# Patient Record
Sex: Female | Born: 1970 | Race: Black or African American | Marital: Single | Smoking: Never smoker
Health system: Southern US, Community
[De-identification: ages and names within clinical notes are randomized; demographics above are authoritative.]

## PROBLEM LIST (undated history)

## (undated) DIAGNOSIS — I471 Supraventricular tachycardia, unspecified: Secondary | ICD-10-CM

## (undated) DIAGNOSIS — K469 Unspecified abdominal hernia without obstruction or gangrene: Secondary | ICD-10-CM

## (undated) DIAGNOSIS — I82409 Acute embolism and thrombosis of unspecified deep veins of unspecified lower extremity: Secondary | ICD-10-CM

## (undated) DIAGNOSIS — K760 Fatty (change of) liver, not elsewhere classified: Secondary | ICD-10-CM

## (undated) DIAGNOSIS — D649 Anemia, unspecified: Secondary | ICD-10-CM

## (undated) DIAGNOSIS — M549 Dorsalgia, unspecified: Secondary | ICD-10-CM

## (undated) DIAGNOSIS — R002 Palpitations: Secondary | ICD-10-CM

## (undated) DIAGNOSIS — E7800 Pure hypercholesterolemia, unspecified: Secondary | ICD-10-CM

## (undated) DIAGNOSIS — D3500 Benign neoplasm of unspecified adrenal gland: Secondary | ICD-10-CM

## (undated) DIAGNOSIS — I10 Essential (primary) hypertension: Secondary | ICD-10-CM

## (undated) DIAGNOSIS — M62838 Other muscle spasm: Secondary | ICD-10-CM

## (undated) DIAGNOSIS — E876 Hypokalemia: Secondary | ICD-10-CM

## (undated) DIAGNOSIS — N926 Irregular menstruation, unspecified: Secondary | ICD-10-CM

## (undated) DIAGNOSIS — D219 Benign neoplasm of connective and other soft tissue, unspecified: Secondary | ICD-10-CM

## (undated) DIAGNOSIS — N83209 Unspecified ovarian cyst, unspecified side: Secondary | ICD-10-CM

## (undated) DIAGNOSIS — E278 Other specified disorders of adrenal gland: Secondary | ICD-10-CM

## (undated) DIAGNOSIS — E119 Type 2 diabetes mellitus without complications: Secondary | ICD-10-CM

## (undated) DIAGNOSIS — G4730 Sleep apnea, unspecified: Secondary | ICD-10-CM

## (undated) DIAGNOSIS — M542 Cervicalgia: Secondary | ICD-10-CM

## (undated) DIAGNOSIS — M4800 Spinal stenosis, site unspecified: Secondary | ICD-10-CM

## (undated) HISTORY — DX: Dorsalgia, unspecified: M54.9

## (undated) HISTORY — DX: Unspecified abdominal hernia without obstruction or gangrene: K46.9

## (undated) HISTORY — PX: NO PAST SURGERIES: SHX2092

## (undated) HISTORY — DX: Irregular menstruation, unspecified: N92.6

## (undated) HISTORY — DX: Anemia, unspecified: D64.9

## (undated) HISTORY — DX: Unspecified ovarian cyst, unspecified side: N83.209

## (undated) HISTORY — DX: Palpitations: R00.2

## (undated) HISTORY — DX: Sleep apnea, unspecified: G47.30

## (undated) HISTORY — DX: Other muscle spasm: M62.838

## (undated) HISTORY — DX: Cervicalgia: M54.2

## (undated) HISTORY — DX: Hypokalemia: E87.6

---

## 2009-04-11 ENCOUNTER — Emergency Department: Payer: Self-pay

## 2009-04-26 ENCOUNTER — Emergency Department: Payer: Self-pay | Admitting: Emergency Medicine

## 2010-06-12 ENCOUNTER — Inpatient Hospital Stay: Payer: Self-pay | Admitting: Internal Medicine

## 2010-06-15 ENCOUNTER — Ambulatory Visit: Payer: Self-pay | Admitting: Internal Medicine

## 2010-08-29 ENCOUNTER — Ambulatory Visit: Payer: Self-pay

## 2010-10-13 ENCOUNTER — Emergency Department: Payer: Self-pay | Admitting: Emergency Medicine

## 2010-12-04 ENCOUNTER — Other Ambulatory Visit: Payer: Self-pay | Admitting: Orthopedic Surgery

## 2010-12-04 DIAGNOSIS — M25561 Pain in right knee: Secondary | ICD-10-CM

## 2010-12-09 ENCOUNTER — Ambulatory Visit
Admission: RE | Admit: 2010-12-09 | Discharge: 2010-12-09 | Disposition: A | Discharge status: Home / Self Care | Payer: BC Managed Care – PPO | Source: Ambulatory Visit | Attending: Orthopedic Surgery | Admitting: Orthopedic Surgery

## 2010-12-09 DIAGNOSIS — M25561 Pain in right knee: Secondary | ICD-10-CM

## 2011-02-19 ENCOUNTER — Ambulatory Visit: Payer: Self-pay

## 2011-04-19 ENCOUNTER — Ambulatory Visit: Payer: Self-pay

## 2011-11-14 ENCOUNTER — Encounter (HOSPITAL_COMMUNITY): Payer: Self-pay | Admitting: Emergency Medicine

## 2011-11-14 ENCOUNTER — Emergency Department (HOSPITAL_COMMUNITY): Payer: No Typology Code available for payment source

## 2011-11-14 ENCOUNTER — Emergency Department (HOSPITAL_COMMUNITY)
Admission: EM | Admit: 2011-11-14 | Discharge: 2011-11-14 | Disposition: A | Discharge status: Home / Self Care | Payer: No Typology Code available for payment source | Attending: Emergency Medicine | Admitting: Emergency Medicine

## 2011-11-14 DIAGNOSIS — Z79899 Other long term (current) drug therapy: Secondary | ICD-10-CM | POA: Insufficient documentation

## 2011-11-14 DIAGNOSIS — M25469 Effusion, unspecified knee: Secondary | ICD-10-CM

## 2011-11-14 DIAGNOSIS — Z86718 Personal history of other venous thrombosis and embolism: Secondary | ICD-10-CM | POA: Insufficient documentation

## 2011-11-14 DIAGNOSIS — M79609 Pain in unspecified limb: Secondary | ICD-10-CM | POA: Insufficient documentation

## 2011-11-14 DIAGNOSIS — E119 Type 2 diabetes mellitus without complications: Secondary | ICD-10-CM | POA: Insufficient documentation

## 2011-11-14 DIAGNOSIS — Y92410 Unspecified street and highway as the place of occurrence of the external cause: Secondary | ICD-10-CM | POA: Insufficient documentation

## 2011-11-14 DIAGNOSIS — I10 Essential (primary) hypertension: Secondary | ICD-10-CM | POA: Insufficient documentation

## 2011-11-14 DIAGNOSIS — M549 Dorsalgia, unspecified: Secondary | ICD-10-CM | POA: Insufficient documentation

## 2011-11-14 DIAGNOSIS — M542 Cervicalgia: Secondary | ICD-10-CM | POA: Insufficient documentation

## 2011-11-14 HISTORY — DX: Supraventricular tachycardia: I47.1

## 2011-11-14 HISTORY — DX: Essential (primary) hypertension: I10

## 2011-11-14 HISTORY — DX: Acute embolism and thrombosis of unspecified deep veins of unspecified lower extremity: I82.409

## 2011-11-14 HISTORY — DX: Supraventricular tachycardia, unspecified: I47.10

## 2011-11-14 HISTORY — DX: Other specified disorders of adrenal gland: E27.8

## 2011-11-14 HISTORY — DX: Benign neoplasm of connective and other soft tissue, unspecified: D21.9

## 2011-11-14 MED ORDER — CYCLOBENZAPRINE HCL 5 MG PO TABS: 5.0000 mg | ORAL_TABLET | Freq: Three times a day (TID) | ORAL | Status: AC | PRN

## 2011-11-14 MED ORDER — HYDROCODONE-ACETAMINOPHEN 5-325 MG PO TABS: 1.0000 | ORAL_TABLET | ORAL | Status: AC | PRN
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
## 2011-11-14 NOTE — Discharge Instructions (Signed)
Knee Effusion The medical term for having fluid in your knee is effusion. This is often due to an internal derangement of the knee. This means something is wrong inside the knee. Some of the causes of fluid in the knee may be torn cartilage, a torn ligament, or bleeding into the joint from an injury. Your knee is likely more difficult to bend and move. This is often because there is increased pain and pressure in the joint. The time it takes for recovery from a knee effusion depends on different factors, including:   Type of injury.   Your age.   Physical and medical conditions.   Rehabilitation Strategies.  How long you will be away from your normal activities will depend on what kind of knee problem you have and how much damage is present. Your knee has two types of cartilage. Articular cartilage covers the bone ends and lets your knee bend and move smoothly. Two menisci, thick pads of cartilage that form a rim inside the joint, help absorb shock and stabilize your knee. Ligaments bind the bones together and support your knee joint. Muscles move the joint, help support your knee, and take stress off the joint itself. CAUSES  Often an effusion in the knee is caused by an injury to one of the menisci. This is often a tear in the cartilage. Recovery after a meniscus injury depends on how much meniscus is damaged and whether you have damaged other knee tissue. Small tears may heal on their own with conservative treatment. Conservative means rest, limited weight bearing activity and muscle strengthening exercises. Your recovery may take up to 6 weeks.  TREATMENT  Larger tears may require surgery. Meniscus injuries may be treated during arthroscopy. Arthroscopy is a procedure in which your surgeon uses a small telescope like instrument to look in your knee. Your caregiver can make a more accurate diagnosis (learning what is wrong) by performing an arthroscopic procedure. If your injury is on the inner  margin of the meniscus, your surgeon may trim the meniscus back to a smooth rim. In other cases your surgeon will try to repair a damaged meniscus with stitches (sutures). This may make rehabilitation take longer, but may provide better long term result by helping your knee keep its shock absorption capabilities. Ligaments which are completely torn usually require surgery for repair. HOME CARE INSTRUCTIONS  Use crutches as instructed.   If a brace is applied, use as directed.   Once you are home, an ice pack applied to your swollen knee may help with discomfort and help decrease swelling.   Keep your knee raised (elevated) when you are not up and around or on crutches.   Only take over-the-counter or prescription medicines for pain, discomfort, or fever as directed by your caregiver.   Your caregivers will help with instructions for rehabilitation of your knee. This often includes strengthening exercises.   You may resume a normal diet and activities as directed.  SEEK MEDICAL CARE IF:   There is increased swelling in your knee.   You notice redness, swelling, or increasing pain in your knee.   An unexplained oral temperature above 102 F (38.9 C) develops.  SEEK IMMEDIATE MEDICAL CARE IF:   You develop a rash.   You have difficulty breathing.   You have any allergic reactions from medications you may have been given.   There is severe pain with any motion of the knee.  MAKE SURE YOU:   Understand these instructions.  Will watch your condition.   Will get help right away if you are not doing well or get worse.  Document Released: 12/07/2003 Document Revised: 05/29/2011 Document Reviewed: 02/10/2008 [facility] Patient Information 2012 [place], [place].Motor Vehicle Collision  It is common to have multiple bruises and sore muscles after a motor vehicle collision (MVC). These tend to feel worse for the first 24 hours. You may have the most stiffness and soreness over the first  several hours. You may also feel worse when you wake up the first morning after your collision. After this point, you will usually begin to improve with each day. The speed of improvement often depends on the severity of the collision, the number of injuries, and the location and nature of these injuries. HOME CARE INSTRUCTIONS   Put ice on the injured area.   Put ice in a plastic bag.   Place a towel between your skin and the bag.   Leave the ice on for 15 to 20 minutes, 3 to 4 times a day.   Drink enough fluids to keep your urine clear or pale yellow. Do not drink alcohol.   Take a warm shower or bath once or twice a day. This will increase blood flow to sore muscles.   You may return to activities as directed by your caregiver. Be careful when lifting, as this may aggravate neck or back pain.   Only take over-the-counter or prescription medicines for pain, discomfort, or fever as directed by your caregiver. Do not use aspirin. This may increase bruising and bleeding.  SEEK IMMEDIATE MEDICAL CARE IF:  You have numbness, tingling, or weakness in the arms or legs.   You develop severe headaches not relieved with medicine.   You have severe neck pain, especially tenderness in the middle of the back of your neck.   You have changes in bowel or bladder control.   There is increasing pain in any area of the body.   You have shortness of breath, lightheadedness, dizziness, or fainting.   You have chest pain.   You feel sick to your stomach (nauseous), throw up (vomit), or sweat.   You have increasing abdominal discomfort.   There is blood in your urine, stool, or vomit.   You have pain in your shoulder (shoulder strap areas).   You feel your symptoms are getting worse.  MAKE SURE YOU:   Understand these instructions.   Will watch your condition.   Will get help right away if you are not doing well or get worse.  Document Released: 09/16/2005 Document Revised: 05/29/2011  Document Reviewed: 02/13/2011 [facility] Patient Information 2012 [place], [place].Motor Vehicle Collision  It is common to have multiple bruises and sore muscles after a motor vehicle collision (MVC). These tend to feel worse for the first 24 hours. You may have the most stiffness and soreness over the first several hours. You may also feel worse when you wake up the first morning after your collision. After this point, you will usually begin to improve with each day. The speed of improvement often depends on the severity of the collision, the number of injuries, and the location and nature of these injuries. HOME CARE INSTRUCTIONS   Put ice on the injured area.   Put ice in a plastic bag.   Place a towel between your skin and the bag.   Leave the ice on for 15 to 20 minutes, 3 to 4 times a day.   Drink enough fluids to keep your  urine clear or pale yellow. Do not drink alcohol.   Take a warm shower or bath once or twice a day. This will increase blood flow to sore muscles.   You may return to activities as directed by your caregiver. Be careful when lifting, as this may aggravate neck or back pain.   Only take over-the-counter or prescription medicines for pain, discomfort, or fever as directed by your caregiver. Do not use aspirin. This may increase bruising and bleeding.  SEEK IMMEDIATE MEDICAL CARE IF:  You have numbness, tingling, or weakness in the arms or legs.   You develop severe headaches not relieved with medicine.   You have severe neck pain, especially tenderness in the middle of the back of your neck.   You have changes in bowel or bladder control.   There is increasing pain in any area of the body.   You have shortness of breath, lightheadedness, dizziness, or fainting.   You have chest pain.   You feel sick to your stomach (nauseous), throw up (vomit), or sweat.   You have increasing abdominal discomfort.   There is blood in your urine, stool, or vomit.   You  have pain in your shoulder (shoulder strap areas).   You feel your symptoms are getting worse.  MAKE SURE YOU:  Understand these instructions. Motor Vehicle Collision  It is common to have multiple bruises and sore muscles after a motor vehicle collision (MVC). These tend to feel worse for the first 24 hours. You may have the most stiffness and soreness over the first several hours. You may also feel worse when you wake up the first morning after your collision. After this point, you will usually begin to improve with each day. The speed of improvement often depends on the severity of the collision, the number of injuries, and the location and nature of these injuries. HOME CARE INSTRUCTIONS  Put ice on the injured area.  Put ice in a plastic bag.  Place a towel between your skin and the bag.  Leave the ice on for 15 to 20 minutes, 3 to 4 times a day.  Drink enough fluids to keep your urine clear or pale yellow. Do not drink alcohol.  Take a warm shower or bath once or twice a day. This will increase blood flow to sore muscles.  You may return to activities as directed by your caregiver. Be careful when lifting, as this may aggravate neck or back pain.  Only take over-the-counter or prescription medicines for pain, discomfort, or fever as directed by your caregiver. Do not use aspirin. This may increase bruising and bleeding.  SEEK IMMEDIATE MEDICAL CARE IF: You have numbness, tingling, or weakness in the arms or legs.  You develop severe headaches not relieved with medicine.  You have severe neck pain, especially tenderness in the middle of the back of your neck.  You have changes in bowel or bladder control.  There is increasing pain in any area of the body.  You have shortness of breath, lightheadedness, dizziness, or fainting.  You have chest pain.  You feel sick to your stomach (nauseous), throw up (vomit), or sweat.  You have increasing abdominal discomfort.  There is blood in your  urine, stool, or vomit.  You have pain in your shoulder (shoulder strap areas).  You feel your symptoms are getting worse.  MAKE SURE YOU:  Understand these instructions.  Will watch your condition.  Will get help right away if you are not  doing well or get worse.  Document Released: 09/16/2005 Document Revised: 05/29/2011 Document Reviewed: 02/13/2011  [facility] Patient Information 2012 [place], [place].  Will watch your condition.   Will get help right away if you are not doing well or get worse.  Document Released: 09/16/2005 Document Revised: 05/29/2011 Document Reviewed: 02/13/2011 [facility] Patient Information 2012 [place], [place].

## 2011-11-18 NOTE — ED Provider Notes (Signed)
History/physical exam/procedure(s) were performed by non-physician practitioner and as supervising physician I was immediately available for consultation/collaboration. I have reviewed all notes and am in agreement with care and plan.   Hilario Quarry, MD 11/18/11 [phone]

## 2012-06-04 ENCOUNTER — Ambulatory Visit: Payer: Self-pay | Admitting: Internal Medicine

## 2012-07-21 ENCOUNTER — Other Ambulatory Visit: Payer: Self-pay | Admitting: Internal Medicine

## 2012-09-09 ENCOUNTER — Ambulatory Visit
Admission: RE | Admit: 2012-09-09 | Discharge: 2012-09-09 | Disposition: A | Discharge status: Home / Self Care | Payer: BC Managed Care – PPO | Source: Ambulatory Visit | Attending: Internal Medicine | Admitting: Internal Medicine

## 2012-12-18 ENCOUNTER — Ambulatory Visit
Admission: RE | Admit: 2012-12-18 | Discharge: 2012-12-18 | Disposition: A | Discharge status: Home / Self Care | Payer: BC Managed Care – PPO | Source: Ambulatory Visit | Attending: Internal Medicine | Admitting: Internal Medicine

## 2012-12-18 ENCOUNTER — Emergency Department (HOSPITAL_COMMUNITY)
Admission: EM | Admit: 2012-12-18 | Discharge: 2012-12-18 | Disposition: A | Discharge status: Home / Self Care | Payer: BC Managed Care – PPO | Attending: Emergency Medicine | Admitting: Emergency Medicine

## 2012-12-18 ENCOUNTER — Emergency Department (HOSPITAL_COMMUNITY): Payer: BC Managed Care – PPO

## 2012-12-18 ENCOUNTER — Encounter (HOSPITAL_COMMUNITY): Payer: Self-pay

## 2012-12-18 ENCOUNTER — Other Ambulatory Visit: Payer: Self-pay | Admitting: Family

## 2012-12-18 DIAGNOSIS — I4891 Unspecified atrial fibrillation: Secondary | ICD-10-CM

## 2012-12-18 DIAGNOSIS — Z8719 Personal history of other diseases of the digestive system: Secondary | ICD-10-CM | POA: Insufficient documentation

## 2012-12-18 DIAGNOSIS — Z862 Personal history of diseases of the blood and blood-forming organs and certain disorders involving the immune mechanism: Secondary | ICD-10-CM | POA: Insufficient documentation

## 2012-12-18 DIAGNOSIS — R350 Frequency of micturition: Secondary | ICD-10-CM | POA: Insufficient documentation

## 2012-12-18 DIAGNOSIS — E119 Type 2 diabetes mellitus without complications: Secondary | ICD-10-CM | POA: Insufficient documentation

## 2012-12-18 DIAGNOSIS — R079 Chest pain, unspecified: Secondary | ICD-10-CM | POA: Insufficient documentation

## 2012-12-18 DIAGNOSIS — R002 Palpitations: Secondary | ICD-10-CM | POA: Insufficient documentation

## 2012-12-18 DIAGNOSIS — R000 Tachycardia, unspecified: Secondary | ICD-10-CM

## 2012-12-18 DIAGNOSIS — Z8639 Personal history of other endocrine, nutritional and metabolic disease: Secondary | ICD-10-CM | POA: Insufficient documentation

## 2012-12-18 DIAGNOSIS — Z8739 Personal history of other diseases of the musculoskeletal system and connective tissue: Secondary | ICD-10-CM | POA: Insufficient documentation

## 2012-12-18 DIAGNOSIS — I10 Essential (primary) hypertension: Secondary | ICD-10-CM | POA: Insufficient documentation

## 2012-12-18 HISTORY — DX: Benign neoplasm of unspecified adrenal gland: D35.00

## 2012-12-18 HISTORY — DX: Pure hypercholesterolemia, unspecified: E78.00

## 2012-12-18 HISTORY — DX: Fatty (change of) liver, not elsewhere classified: K76.0

## 2012-12-18 HISTORY — DX: Spinal stenosis, site unspecified: M48.00

## 2012-12-18 HISTORY — DX: Type 2 diabetes mellitus without complications: E11.9

## 2012-12-18 LAB — COMPREHENSIVE METABOLIC PANEL
Albumin: 3.9 g/dL (ref 3.5–5.2)
Alkaline Phosphatase: 81 U/L (ref 39–117)
BUN: 12 mg/dL (ref 6–23)
Chloride: 98 mEq/L (ref 96–112)
Creatinine, Ser: 0.85 mg/dL (ref 0.50–1.10)
GFR calc Af Amer: >90 mL/min (ref >90)
Glucose, Bld: 106 mg/dL — ABNORMAL HIGH (ref 70–99)
Potassium: 3.5 mEq/L (ref 3.5–5.1)
Total Bilirubin: 0.4 mg/dL (ref 0.3–1.2)

## 2012-12-18 LAB — CBC WITH DIFFERENTIAL/PLATELET
Basophils Absolute: 0 10*3/uL (ref 0.0–0.1)
Basophils Relative: 0 % (ref 0–1)
Eosinophils Absolute: 0.1 10*3/uL (ref 0.0–0.7)
HCT: 39.5 % (ref 36.0–46.0)
Hemoglobin: 13 g/dL (ref 12.0–15.0)
Lymphs Abs: 2.6 10*3/uL (ref 0.7–4.0)
MCHC: 32.9 g/dL (ref 30.0–36.0)
MCV: 70.8 fL — ABNORMAL LOW (ref 78.0–100.0)
Monocytes Relative: 7 % (ref 3–12)
Neutro Abs: 4.6 10*3/uL (ref 1.7–7.7)
RDW: 14.9 % (ref 11.5–15.5)

## 2012-12-18 LAB — PROTIME-INR
INR: 0.96 (ref 0.00–1.49)
Prothrombin Time: 12.7 seconds (ref 11.6–15.2)

## 2012-12-18 MED ORDER — SODIUM CHLORIDE 0.9 % IV BOLUS (SEPSIS)
1000.0000 mL | Freq: Once | INTRAVENOUS | Status: AC
  Administered 2012-12-18: 1000 mL via INTRAVENOUS

## 2012-12-18 MED ORDER — IOHEXOL 350 MG/ML SOLN
60.0000 mL | Freq: Once | INTRAVENOUS | Status: AC | PRN
  Administered 2012-12-18: 60 mL via INTRAVENOUS

## 2012-12-18 NOTE — ED Provider Notes (Signed)
I have supervised the resident on the management of this patient and agree with the note above. I personally interviewed and examined the patient and my addendum is below.   Diana Patton is a 42 y.o. female hx of R leg DVT off coumadin here with SOB. SOB at baseline, worse over last few days. Outpatient xray showed pneumothorax. Patient tachycardic 130s on the monitor, with nonspecific changes that is similar to prior. Good breath sounds bilaterally, not hypoxic. Will repeat CXR. Concerned for possible PE so if CXR showed insignificant pneumothorax, may need CT angio chest.   7:44 PM CXR showed no pneumothorax. CT angio chest showed no PE. Tachycardia resolved and back to sinus rhythm in 70s. Stable for d/c.   Richardean Canal, MD 12/18/12 [phone]

## 2012-12-18 NOTE — ED Notes (Signed)
Breath sounds auscultated by this RN in all fields. No labored breathing noted. Pt tearful, states she is scared. Pt reassured by this RN. Pt states she feels sob "but I'm overweight so it might be from that."

## 2012-12-18 NOTE — ED Notes (Signed)
Received bedside report from [employer], Charity fundraiser.  Patient currently sitting up in bed; no respiratory or acute distress noted.  Patient given happy meal ([place] sandwich) by nurse tech; Gildardo Cranker by EDP.  Repeat ED EKG done and given to EDP.  Patient denies any needs at this time; will continue to monitor.

## 2012-12-18 NOTE — ED Notes (Signed)
Patient given discharge instructions for tachycardia. No rx was provided. Advised to follow up with primary care or to return to this department if condition worsens. Patient voiced understanding of all instructions and had no further questions. Patient ambulated to front lobby without difficulty

## 2012-12-18 NOTE — ED Notes (Signed)
Patient report obtained from Crystal, care taken over at this time.

## 2012-12-18 NOTE — ED Provider Notes (Signed)
History     CSN: [id number]  Arrival date & time 12/18/12  1433   First MD Initiated Contact with Patient 12/18/12 1500      No chief complaint on file.   (Consider location/radiation/quality/duration/timing/severity/associated sxs/prior treatment) Patient is a 42 y.o. female presenting with palpitations.  Palpitations  This is a new problem. The current episode started 6 to 12 hours ago. The problem occurs constantly. The problem has not changed since onset.The problem is associated with an unknown factor. Associated symptoms include chest pain ("burning") and shortness of breath. Pertinent negatives include no fever, no abdominal pain, no nausea, no vomiting and no cough. She has tried nothing for the symptoms.    Past Medical History  Diagnosis Date  . Hypertension   . Diabetes mellitus without complication   . Benign tumor of adrenal gland   . Spinal stenosis   . Fatty liver   . Hypercholesterolemia     History reviewed. No pertinent past surgical history.  History reviewed. No pertinent family history.  History  Substance Use Topics  . Smoking status: Never Smoker   . Smokeless tobacco: Not on file  . Alcohol Use: No    OB History   Grav Para Term Preterm Abortions TAB SAB Ect Mult Living                  Review of Systems  Constitutional: Negative for fever.  HENT: Negative for congestion.   Respiratory: Positive for shortness of breath. Negative for cough.   Cardiovascular: Positive for chest pain ("burning") and palpitations.  Gastrointestinal: Negative for nausea, vomiting, abdominal pain and diarrhea.  Genitourinary: Positive for frequency. Negative for difficulty urinating.  All other systems reviewed and are negative.    Allergies  Review of patient's allergies indicates no known allergies.  Home Medications  No current outpatient prescriptions on file.  BP 145/96  Pulse 131  Temp(Src) 98.1 F (36.7 C) (Oral)  Resp 26  SpO2 96%  LMP  11/06/2012  Physical Exam  Nursing note and vitals reviewed. Constitutional: She is oriented to person, place, and time. She appears well-developed and well-nourished. No distress.  obese  HENT:  Head: Normocephalic and atraumatic.  Mouth/Throat: Oropharynx is clear and moist.  Eyes: Conjunctivae are normal. Pupils are equal, round, and reactive to light. No scleral icterus.  Neck: Neck supple.  Cardiovascular: Normal rate, regular rhythm, normal heart sounds and intact distal pulses.   No murmur heard. Pulmonary/Chest: Effort normal and breath sounds normal. No stridor. No respiratory distress. She has no wheezes. She has no rales. She exhibits no tenderness.  Abdominal: Soft. Bowel sounds are normal. She exhibits no distension. There is no tenderness. There is no rebound and no guarding.  Musculoskeletal: Normal range of motion. She exhibits no edema.  Neurological: She is alert and oriented to person, place, and time.  Skin: Skin is warm and dry. No rash noted.  Psychiatric: She has a normal mood and affect. Her behavior is normal.    ED Course  Procedures (including critical care time)  Labs Reviewed - No data to display No results found.   Date: 12/18/2012  Rate: 132  Rhythm: indeterminate  QRS Axis: normal  Intervals: normal  ST/T Wave abnormalities: nonspecific ST/T changes  Conduction Disutrbances:none  Narrative Interpretation:   Old EKG Reviewed: none available   1. Tachycardia       MDM   3:18 PM 42 yo female presenting from her PCP secondary to a chest xray demonstrating "partial  lung collapse".  Do not have access to this film, so will repeat.  Her complaint to her PCP was palpitations.  She is tachycardic here, but in no distress.  Burning, but not pleuritic, chest pain.  Plan to obtain labs and plain film, but will keep PE in differential for now.  Hx not consistent with  ACS.    3:28 PM EKG shows an indeterminate rhythm (regular appearing rhythm with no  discernable p waves).  Rate would be slow for SVT and pt stable.  Plan to treat with IV fluids and see results of imaging/labs.    4:28 PM no Pneumothorax.  Will order CTA PE study.    7:26 PM CTA negative.  Pt in sinus rhythm with a rate in 70's on monitor.  7:38 PM EKG confirms NSR with nonspecific T wave changes consistent with prior.  She will follow up with her PCP who is in process of referring her to cardiology for her intermittent tachycardia.  Asymptomatic at moment.     Rennis Petty, MD 12/18/12 [phone]

## 2012-12-18 NOTE — ED Notes (Signed)
Pt presents with reported pneumothorax from  PCP.  Pt reports she awoke with palpitations and went to PCP, had xray that showed pneumothorax.  Pt reports shortness of breath today, denies any pain.

## 2012-12-21 ENCOUNTER — Encounter: Payer: Self-pay | Admitting: *Deleted

## 2012-12-21 ENCOUNTER — Telehealth: Payer: Self-pay

## 2012-12-21 NOTE — Telephone Encounter (Signed)
LMOM TCB x1 for [facility] have been received from [place], per cxr and CTA no pneumothorax, positive for ateclectasis in both lower lobes No openings in office today for new patient ATC pt to discuss her symptoms > line rang multiple times, then switched to busy signal. Records given to [place] for 1145 tomorrow @ 3.25.14

## 2012-12-22 ENCOUNTER — Ambulatory Visit (INDEPENDENT_AMBULATORY_CARE_PROVIDER_SITE_OTHER): Payer: BC Managed Care – PPO | Admitting: Pulmonary Disease

## 2012-12-22 ENCOUNTER — Encounter: Payer: Self-pay | Admitting: Pulmonary Disease

## 2012-12-22 ENCOUNTER — Encounter: Payer: Self-pay | Admitting: *Deleted

## 2012-12-22 VITALS — BP 130/88 | HR 66 | Temp 98.0°F | Ht 68.0 in | Wt 347.4 lb

## 2012-12-22 DIAGNOSIS — R0609 Other forms of dyspnea: Secondary | ICD-10-CM

## 2012-12-22 DIAGNOSIS — R0989 Other specified symptoms and signs involving the circulatory and respiratory systems: Secondary | ICD-10-CM

## 2012-12-22 DIAGNOSIS — R0600 Dyspnea, unspecified: Secondary | ICD-10-CM | POA: Insufficient documentation

## 2012-12-22 NOTE — Assessment & Plan Note (Signed)
The pt has significant doe that I suspect is primarily related to her morbid obesity and deconditioning.  Would also consider whether she may have an underlying cardiac issue such as diastolic dysfunction or pulmonary htn.  May need cardiac evaluation per her primary md.  I see nothing by history, exam, or xrays to indicate a pulmonary issue.  I am willing to do PFT"s since they can provide a lot of information, but suspect they will be unremarkable except for restriction from her centripetal obesity.

## 2012-12-22 NOTE — Patient Instructions (Addendum)
Will schedule for breathing studies in the next few weeks, and will call you with results. Work on weight loss and conditioning.

## 2012-12-22 NOTE — Telephone Encounter (Signed)
Will sign off message as pt has appt for today with [facility]

## 2012-12-22 NOTE — Progress Notes (Signed)
  Subjective:    Patient ID: Diana Patton, female    DOB: [birth year], 42 y.o.   MRN: [id number]  HPI The pt is a 42y/o female who I have been asked to see for doe.  She is morbidly obese, and has had a recent ct chest with no PE, and no pulmonary abnl except mild compressive atx in both bases.  She notes worsening sob above her usual baseline the past 2 weeks.  Denies sob at rest.  She describes a less than one block doe at a moderate pace on flat ground, and cannot walk up stairs without getting severely sob.  She denies any cough or mucus production, and has some intermittant LE edema.  She tells me her weight is down a few pounds from one year ago.  She has no h/o asthma, and has never had PFT's.     Review of Systems  Constitutional: Negative for fever and unexpected weight change.  HENT: Positive for rhinorrhea, sneezing, postnasal drip and sinus pressure. Negative for ear pain, nosebleeds, congestion, sore throat, trouble swallowing and dental problem.   Eyes: Negative for redness and itching.  Respiratory: Positive for cough. Negative for chest tightness, shortness of breath and wheezing.   Cardiovascular: Positive for palpitations. Negative for leg swelling.  Gastrointestinal: Negative for nausea and vomiting.  Genitourinary: Positive for frequency. Negative for dysuria.  Musculoskeletal: Negative for joint swelling.  Skin: Positive for rash ( stomach x 2 weeks--itching).  Neurological: Positive for headaches.  Hematological: Does not bruise/bleed easily.  Psychiatric/Behavioral: Positive for dysphoric mood. The patient is nervous/anxious.        Objective:   Physical Exam Constitutional:  Morbidly obese female, no acute distress  HENT:  Nares patent without discharge  Oropharynx without exudate, palate and uvula are elongated.  Eyes:  Perrla, eomi, no scleral icterus  Neck:  No JVD, +tissue redundancy that is ?TMG  Cardiovascular:  Normal rate, regular rhythm, no rubs or  gallops.  No murmurs        Intact distal pulses  Pulmonary :  Normal breath sounds, no stridor or respiratory distress   No rhonchi, or wheezing.  Minimal bibasilar crackles.   Abdominal:  Soft, nondistended, bowel sounds present.  No tenderness noted.   Musculoskeletal:  mild lower extremity edema noted.  Lymph Nodes:  No cervical lymphadenopathy noted  Skin:  No cyanosis noted  Neurologic:  Alert, appropriate, moves all 4 extremities without obvious deficit.         Assessment & Plan:

## 2012-12-23 ENCOUNTER — Ambulatory Visit (HOSPITAL_COMMUNITY)
Admission: RE | Admit: 2012-12-23 | Discharge: 2012-12-23 | Disposition: A | Discharge status: Home / Self Care | Payer: BC Managed Care – PPO | Source: Ambulatory Visit | Attending: Pulmonary Disease | Admitting: Pulmonary Disease

## 2012-12-23 DIAGNOSIS — R05 Cough: Secondary | ICD-10-CM | POA: Insufficient documentation

## 2012-12-23 DIAGNOSIS — R0600 Dyspnea, unspecified: Secondary | ICD-10-CM

## 2012-12-23 DIAGNOSIS — R0989 Other specified symptoms and signs involving the circulatory and respiratory systems: Secondary | ICD-10-CM | POA: Insufficient documentation

## 2012-12-23 DIAGNOSIS — R0609 Other forms of dyspnea: Secondary | ICD-10-CM | POA: Insufficient documentation

## 2012-12-23 DIAGNOSIS — J988 Other specified respiratory disorders: Secondary | ICD-10-CM | POA: Insufficient documentation

## 2012-12-23 DIAGNOSIS — R059 Cough, unspecified: Secondary | ICD-10-CM | POA: Insufficient documentation

## 2012-12-23 DIAGNOSIS — Z79899 Other long term (current) drug therapy: Secondary | ICD-10-CM | POA: Insufficient documentation

## 2012-12-23 LAB — PULMONARY FUNCTION TEST

## 2012-12-23 MED ORDER — ALBUTEROL SULFATE (5 MG/ML) 0.5% IN NEBU
2.5000 mg | INHALATION_SOLUTION | Freq: Once | RESPIRATORY_TRACT | Status: AC
  Administered 2012-12-23: 2.5 mg via RESPIRATORY_TRACT

## 2012-12-24 ENCOUNTER — Encounter (HOSPITAL_COMMUNITY): Payer: Self-pay

## 2012-12-31 ENCOUNTER — Telehealth: Payer: Self-pay | Admitting: Pulmonary Disease

## 2012-12-31 NOTE — Telephone Encounter (Signed)
LMTCB

## 2013-01-01 NOTE — Telephone Encounter (Signed)
ATC no answer LMOMTCB

## 2013-01-01 NOTE — Telephone Encounter (Signed)
These were done at [facility].  See if we can get sent over.  Thanks.

## 2013-01-01 NOTE — Telephone Encounter (Signed)
I spoke with pt. She is requesting her PFT results. She is aware will may not get a call back until Monday. Please advise KC thanks

## 2013-01-04 ENCOUNTER — Telehealth: Payer: Self-pay | Admitting: Pulmonary Disease

## 2013-01-04 DIAGNOSIS — R0600 Dyspnea, unspecified: Secondary | ICD-10-CM

## 2013-01-04 NOTE — Telephone Encounter (Signed)
PFT results have been received and placed in KC Skylene Deremer folder for review. Please advise, thanks.   

## 2013-01-04 NOTE — Telephone Encounter (Signed)
Please let pt know that pfts do not show any pulmonary issue.  Suspect her sob is due to her weight and conditioning, but would suggest that she discuss with her primary md whether to consider a cardiac evaluation.

## 2013-01-04 NOTE — Telephone Encounter (Signed)
No Answer//no machine. Will try again later.

## 2013-01-04 NOTE — Telephone Encounter (Signed)
Results have seen requested.

## 2013-01-05 NOTE — Telephone Encounter (Signed)
Spoke with patient--could not find this message at the time--told her would call her back tomorrow with the results. Patient is out of town and we have no contact number. I have tried contacting other contacts on her record and could not get in touch with anyone.   Patient needs results below--please if she calls back inform her of these results. Thanks.

## 2013-01-06 NOTE — Telephone Encounter (Signed)
Patient aware of results and recs per [facility]. Patient states that she has an appt with Cardiologist 01/21/13. Will send to [facility] as FYI of appt with Cardiologist.

## 2013-01-06 NOTE — Telephone Encounter (Signed)
ATC x 2--no answer, then busy signal

## 2013-07-02 DIAGNOSIS — I471 Supraventricular tachycardia: Secondary | ICD-10-CM

## 2013-07-08 ENCOUNTER — Telehealth: Payer: Self-pay | Admitting: Cardiology

## 2013-07-08 NOTE — Telephone Encounter (Signed)
New Problem   Pt calling for monitor results.. Please call back to discuss.

## 2013-07-14 NOTE — Telephone Encounter (Signed)
Follow up   Want heart monitor results----No one has called her back--Mailed monitor back in sept.

## 2013-07-14 NOTE — Telephone Encounter (Signed)
Dr. Mayford Knife, This pt stated she had a heart monitor with [place]. I do not see one in her DI from [facility]. Are you aware of this pt.?

## 2013-07-15 NOTE — Telephone Encounter (Signed)
Can you check on this?

## 2013-07-21 NOTE — Telephone Encounter (Signed)
Pt has been notified of results.

## 2013-09-03 ENCOUNTER — Emergency Department: Payer: Self-pay | Admitting: Emergency Medicine

## 2013-09-03 LAB — CBC
HGB: 12.4 g/dL (ref 12.0–16.0)
MCV: 71 fL — ABNORMAL LOW (ref 80–100)
RBC: 5.47 10*6/uL — ABNORMAL HIGH (ref 3.80–5.20)
RDW: 15.8 % — ABNORMAL HIGH (ref 11.5–14.5)

## 2013-09-03 LAB — COMPREHENSIVE METABOLIC PANEL
Albumin: 3.5 g/dL (ref 3.4–5.0)
Alkaline Phosphatase: 90 U/L
Anion Gap: 6 — ABNORMAL LOW (ref 7–16)
Bilirubin,Total: 0.3 mg/dL (ref 0.2–1.0)
Calcium, Total: 9 mg/dL (ref 8.5–10.1)
Co2: 27 mmol/L (ref 21–32)
Creatinine: 0.77 mg/dL (ref 0.60–1.30)
EGFR (African American): >60
EGFR (Non-African Amer.): >60
Glucose: 145 mg/dL — ABNORMAL HIGH (ref 65–99)
Osmolality: 278 (ref 275–301)
SGOT(AST): 33 U/L (ref 15–37)
SGPT (ALT): 27 U/L (ref 12–78)
Total Protein: 8.2 g/dL (ref 6.4–8.2)

## 2013-09-03 LAB — CK TOTAL AND CKMB (NOT AT ARMC): CK, Total: 165 U/L (ref 21–215)

## 2013-09-09 ENCOUNTER — Ambulatory Visit
Admission: RE | Admit: 2013-09-09 | Discharge: 2013-09-09 | Disposition: A | Discharge status: Home / Self Care | Payer: BC Managed Care – PPO | Source: Ambulatory Visit | Attending: Physician Assistant | Admitting: Physician Assistant

## 2013-09-09 ENCOUNTER — Other Ambulatory Visit: Payer: Self-pay | Admitting: Physician Assistant

## 2013-09-09 DIAGNOSIS — R509 Fever, unspecified: Secondary | ICD-10-CM

## 2013-09-09 DIAGNOSIS — R05 Cough: Secondary | ICD-10-CM

## 2013-09-15 ENCOUNTER — Emergency Department: Payer: Self-pay | Admitting: Emergency Medicine

## 2013-09-15 LAB — BASIC METABOLIC PANEL
BUN: 14 mg/dL (ref 7–18)
Calcium, Total: 9.6 mg/dL (ref 8.5–10.1)
Co2: 26 mmol/L (ref 21–32)
Creatinine: 0.81 mg/dL (ref 0.60–1.30)
Glucose: 142 mg/dL — ABNORMAL HIGH (ref 65–99)
Osmolality: 275 (ref 275–301)
Potassium: 3.5 mmol/L (ref 3.5–5.1)

## 2013-09-15 LAB — CBC
MCH: 22.8 pg — ABNORMAL LOW (ref 26.0–34.0)
MCHC: 32 g/dL (ref 32.0–36.0)
MCV: 71 fL — ABNORMAL LOW (ref 80–100)
RDW: 15.7 % — ABNORMAL HIGH (ref 11.5–14.5)
WBC: 8 10*3/uL (ref 3.6–11.0)

## 2013-09-15 LAB — TROPONIN I: Troponin-I: <0.02 ng/mL

## 2013-09-16 ENCOUNTER — Ambulatory Visit: Payer: BC Managed Care – PPO | Admitting: Cardiology

## 2013-09-19 ENCOUNTER — Encounter: Payer: Self-pay | Admitting: Cardiology

## 2013-09-20 ENCOUNTER — Ambulatory Visit: Payer: Self-pay | Admitting: Physician Assistant

## 2013-09-21 ENCOUNTER — Encounter: Payer: Self-pay | Admitting: Cardiology

## 2013-09-21 ENCOUNTER — Ambulatory Visit (INDEPENDENT_AMBULATORY_CARE_PROVIDER_SITE_OTHER): Payer: BC Managed Care – PPO | Admitting: Cardiology

## 2013-09-21 ENCOUNTER — Telehealth: Payer: Self-pay | Admitting: Cardiology

## 2013-09-21 ENCOUNTER — Encounter (INDEPENDENT_AMBULATORY_CARE_PROVIDER_SITE_OTHER): Payer: Self-pay

## 2013-09-21 ENCOUNTER — Encounter: Payer: Self-pay | Admitting: General Surgery

## 2013-09-21 VITALS — BP 136/71 | HR 67 | Ht 69.0 in | Wt 346.0 lb

## 2013-09-21 DIAGNOSIS — I471 Supraventricular tachycardia: Secondary | ICD-10-CM | POA: Insufficient documentation

## 2013-09-21 DIAGNOSIS — I10 Essential (primary) hypertension: Secondary | ICD-10-CM

## 2013-09-21 DIAGNOSIS — I498 Other specified cardiac arrhythmias: Secondary | ICD-10-CM

## 2013-09-21 LAB — BASIC METABOLIC PANEL
BUN: 11 mg/dL (ref 6–23)
Calcium: 8.8 mg/dL (ref 8.4–10.5)
Chloride: 103 mEq/L (ref 96–112)
Creatinine, Ser: 0.9 mg/dL (ref 0.4–1.2)
GFR: 87.63 mL/min (ref >60.00)

## 2013-09-21 MED ORDER — ATENOLOL 25 MG PO TABS: 25.0000 mg | ORAL_TABLET | Freq: Every day | ORAL | Status: DC

## 2013-09-21 NOTE — Telephone Encounter (Signed)
Note and made Dr Mayford Knife aware.

## 2013-09-21 NOTE — Telephone Encounter (Signed)
FYI  Patient is taking medication Potassium CL ER 8 meq. She takes 1 pill 2x daily.

## 2013-09-21 NOTE — Progress Notes (Signed)
[address] [place], [place]  [address] Phone: [phone] Fax:  [phone]  Date:  09/21/2013   ID:  Diana Patton, DOB 1971-09-21, MRN [id number]  PCP:  Pcp Not In System  Cardiologist:  Armanda Magic, MD     History of Present Illness: Diana Patton is a 42 y.o. female with a history of DM, HTN and SVT who presents today for followup.  She is doing well.  She was recently in the ER with SVT on 2 separate occasions.  She was given Adenosine both times which terminated it.  She denies any chest pain, dizziness or syncope.  She has chronic SOB which is stable.  She has chronic LE edema.  Wt Readings from Last 3 Encounters:  09/21/13 346 lb (156.945 kg)  12/22/12 347 lb 6.4 oz (157.58 kg)  11/14/11 337 lb (152.862 kg)     Past Medical History  Diagnosis Date  . Diabetes mellitus without complication   . Benign tumor of adrenal gland   . Spinal stenosis   . Fatty liver   . Hypercholesterolemia   . Hypertension   . Diabetes mellitus     Dr Tedd Sias endocrinology  . Fibroids   . DVT (deep venous thrombosis)   . Adrenal mass   . Palpitations   . Back pain     Dr Melrose Nakayama [facility]  . Ovarian cyst   . Sleep apnea   . Neck pain   . Hypokalemia   . Irregular menstrual cycle   . Hernia   . Leg muscle spasm   . Anemia   . SVT (supraventricular tachycardia)     [facility]    Current Outpatient Prescriptions  Medication Sig Dispense Refill  . aspirin EC 81 MG tablet Take 81 mg by mouth daily.      Marland Kitchen atenolol (TENORMIN) 25 MG tablet Take 12.5 mg by mouth daily.      . fluocinonide cream (LIDEX) 0.05 % Apply 1 application topically 2 (two) times daily.      Marland Kitchen HYDROcodone-acetaminophen (NORCO) 10-325 MG per tablet Take 1 tablet by mouth every 6 (six) hours as needed for pain.      Marland Kitchen lovastatin (MEVACOR) 10 MG tablet Take 10 mg by mouth daily.      . metFORMIN (GLUCOPHAGE) 500 MG tablet Take 500 mg by mouth 2 (two) times daily with a meal.      . Multiple  Vitamins-Minerals (MULTIVITAMIN PO) Take 1 tablet by mouth daily.      Marland Kitchen nystatin cream (MYCOSTATIN) Apply 1 application topically 2 (two) times daily.       No current facility-administered medications for this visit.    Allergies:   No Known Allergies  Social History:  The patient  reports that she has never smoked. She does not have any smokeless tobacco history on file. She reports that she does not drink alcohol or use illicit drugs.   Family History:  The patient's family history includes Diabetes in her father and mother; Heart failure in her paternal uncle; Hypertension in her father, mother, and sister.   ROS:  Please see the history of present illness.      All other systems reviewed and negative.   PHYSICAL EXAM: VS:  BP 136/71  Pulse 67  Ht 5\' 9"  (1.753 m)  Wt 346 lb (156.945 kg)  BMI 51.07 kg/m2 Well nourished, well developed, in no acute distress HEENT: normal Neck: no JVD Cardiac:  normal S1, S2; RRR; no  murmur Lungs:  clear to auscultation bilaterally, no wheezing, rhonchi or rales Abd: soft, nontender, no hepatomegaly Ext: trace edema Skin: warm and dry Neuro:  CNs 2-12 intact, no focal abnormalities noted       ASSESSMENT AND PLAN:  1. SVT - recurrent x 2 episodes this month  - Increase Atenolol to 25mg  daily   - She is interested in considering RF ablation of her SVT so I will refer her to Dr. Johney Frame to discuss 2. HTN - well controlled  - continue Atenolol  - check BMET since she is on potassium without a diuretic which was stopped in the hospital   Followup in 6 months  Signed, Armanda Magic, MD 09/21/2013 3:45 PM

## 2013-09-21 NOTE — Patient Instructions (Signed)
Your physician has recommended you make the following change in your medication: Increase Atenolol to 25 Mg 1 Tablet Daily  Your physician recommends that you go to the lab today for a BMET  You have been referred to our EP Department for SVT   Your physician wants you to follow-up in: 6 Months with Dr Sherlyn Lick will receive a reminder letter in the mail two months in advance. If you don't receive a letter, please call our office to schedule the follow-up appointment.

## 2013-09-22 NOTE — Progress Notes (Signed)
LVM for pt to return call

## 2013-09-27 ENCOUNTER — Other Ambulatory Visit: Payer: Self-pay | Admitting: Internal Medicine

## 2013-09-27 DIAGNOSIS — E279 Disorder of adrenal gland, unspecified: Secondary | ICD-10-CM

## 2013-10-01 ENCOUNTER — Other Ambulatory Visit: Payer: BC Managed Care – PPO

## 2013-10-01 NOTE — Progress Notes (Signed)
LVM for pt to return call

## 2013-10-04 ENCOUNTER — Encounter: Payer: Self-pay | Admitting: General Surgery

## 2013-10-13 ENCOUNTER — Encounter: Payer: Self-pay | Admitting: General Surgery

## 2013-10-18 ENCOUNTER — Telehealth: Payer: Self-pay | Admitting: Cardiology

## 2013-10-18 DIAGNOSIS — Z79899 Other long term (current) drug therapy: Secondary | ICD-10-CM

## 2013-10-18 NOTE — Telephone Encounter (Signed)
Pt left message for [place] to leave message on her VM at work but LM for her to call us. Did not want to leave message from Dr. Radford Pax note regarding her labs.

## 2013-10-18 NOTE — Telephone Encounter (Signed)
Follow Up    Pt is following up blood work results. Please call. OK to leave detailed VM at work.

## 2013-10-19 ENCOUNTER — Telehealth: Payer: Self-pay | Admitting: Cardiology

## 2013-10-19 MED ORDER — POTASSIUM CHLORIDE CRYS ER 20 MEQ PO TBCR: 40.0000 meq | EXTENDED_RELEASE_TABLET | Freq: Every day | ORAL | Status: AC

## 2013-10-19 NOTE — Telephone Encounter (Signed)
Pt returned my call. I tried calling pt back and was unable to reach.

## 2013-10-19 NOTE — Telephone Encounter (Signed)
Sent request to scheduling to send referral for pt to [facility].

## 2013-10-19 NOTE — Telephone Encounter (Signed)
Called pt and lvm explaining about Potassium level. Told her I was sending in new rx for 40 Meq of K-dur. Asked her to call me once she got my message so we could set her up with F/U labwork in 3 days. Also stated that we would be setting pt up with a referral to [place] [facility] for low potassium level.

## 2013-10-19 NOTE — Telephone Encounter (Signed)
Follow UP:  Pt is returning a call to [facility].

## 2013-10-20 ENCOUNTER — Encounter: Payer: Self-pay | Admitting: Internal Medicine

## 2013-10-20 NOTE — Addendum Note (Signed)
Addended by: Lily Kocher on: 10/20/2013 01:24 PM   Modules accepted: Orders

## 2013-10-20 NOTE — Telephone Encounter (Signed)
Pt stated she did not have any money until the 20th to pay for a new medication. She will pick the potassium up on 10/29/13 and we will do a repeat BMET on 11/02/13.

## 2013-10-25 ENCOUNTER — Other Ambulatory Visit: Payer: Self-pay | Admitting: General Surgery

## 2013-10-25 DIAGNOSIS — E876 Hypokalemia: Secondary | ICD-10-CM

## 2013-10-26 ENCOUNTER — Telehealth: Payer: Self-pay | Admitting: Cardiology

## 2013-10-26 NOTE — Telephone Encounter (Signed)
Walk In pt Form " Medication List" Dropped off will hold For [facility]

## 2013-10-29 ENCOUNTER — Ambulatory Visit: Payer: BC Managed Care – PPO | Admitting: Internal Medicine

## 2013-11-02 ENCOUNTER — Other Ambulatory Visit (INDEPENDENT_AMBULATORY_CARE_PROVIDER_SITE_OTHER): Payer: BC Managed Care – PPO

## 2013-11-02 DIAGNOSIS — Z79899 Other long term (current) drug therapy: Secondary | ICD-10-CM

## 2013-11-02 LAB — BASIC METABOLIC PANEL
BUN: 15 mg/dL (ref 6–23)
CHLORIDE: 103 meq/L (ref 96–112)
CO2: 27 meq/L (ref 19–32)
CREATININE: 0.8 mg/dL (ref 0.4–1.2)
Calcium: 9 mg/dL (ref 8.4–10.5)
GFR: 103.31 mL/min (ref >60.00)
GLUCOSE: 102 mg/dL — AB (ref 70–99)
Potassium: 3.3 mEq/L — ABNORMAL LOW (ref 3.5–5.1)
Sodium: 138 mEq/L (ref 135–145)

## 2013-11-03 ENCOUNTER — Encounter: Payer: Self-pay | Admitting: General Surgery

## 2013-11-03 ENCOUNTER — Telehealth: Payer: Self-pay | Admitting: Cardiology

## 2013-11-03 NOTE — Telephone Encounter (Signed)
New message ° ° ° ° ° ° ° ° ° °Pt returning nurses call °

## 2013-11-03 NOTE — Telephone Encounter (Signed)
LVM for pt to return call

## 2013-12-28 ENCOUNTER — Encounter: Payer: Self-pay | Admitting: Internal Medicine

## 2013-12-28 ENCOUNTER — Ambulatory Visit (INDEPENDENT_AMBULATORY_CARE_PROVIDER_SITE_OTHER): Payer: BC Managed Care – PPO | Admitting: Internal Medicine

## 2013-12-28 VITALS — BP 127/79 | HR 70 | Ht 69.0 in | Wt 344.0 lb

## 2013-12-28 DIAGNOSIS — I471 Supraventricular tachycardia: Secondary | ICD-10-CM

## 2013-12-28 DIAGNOSIS — I498 Other specified cardiac arrhythmias: Secondary | ICD-10-CM

## 2013-12-28 NOTE — Assessment & Plan Note (Signed)
I have discussed the treatment options with the patient and the risks/benefits/goals/expectations of catheter ablation have been reviewed. She is considering her options and will call us if she would like to proceed. I told her that her obesity would slightly increase her risk of complications but that I thought she could be done safely.

## 2013-12-28 NOTE — Patient Instructions (Signed)
Your physician has recommended that you have an ablation. Catheter ablation is a medical procedure used to treat some cardiac arrhythmias (irregular heartbeats). During catheter ablation, a long, thin, flexible tube is put into a blood vessel in your groin (upper thigh), or neck. This tube is called an ablation catheter. It is then guided to your heart through the blood vessel. Radio frequency waves destroy small areas of heart tissue where abnormal heartbeats may cause an arrhythmia to start. Please see the instruction sheet given to you today.    Call if you would like to proceed  Janan Halter, RN [phone]  Dates 4/9,4/13, 4/20, 4/22, 4/24, 4/27, 4/30,5/6, 5/8, 5/12, 5/15, 5/20, 5/21, 5/27, 5/29

## 2013-12-28 NOTE — Progress Notes (Signed)
HPI Diana Patton is referred today by Dr. Radford Pax for evaluation and management of SVT and to consider catheter ablation. She is a morbidly obese 43 yo woman with polycystic ovaries, HTN, DM, and an almost 20 year history of palpitations and documented SVT. Previously vagal maneuvers would stop her SVT but over the past 2 years these have been ineffective. She notes that her spells stop and start suddenly and are associated with sob but no chest pain or syncope. She has been on both diltiazem and atenolol. She has required adenosine to make her heart racing stop.  No Known Allergies   Current Outpatient Prescriptions  Medication Sig Dispense Refill  . aspirin EC 81 MG tablet Take 81 mg by mouth daily.      Marland Kitchen atenolol (TENORMIN) 50 MG tablet Take 50 mg by mouth daily.      Marland Kitchen diltiazem (CARDIZEM) 120 MG tablet Take 120 mg by mouth daily. Pt thinks this is the right dosage (12/28/13)      . fluocinonide cream (LIDEX) 1.82 % Apply 1 application topically 2 (two) times daily.      . Furosemide (LASIX PO) Take 1 tablet by mouth daily. Pt unaware of dosage (12/28/13)      . HYDROcodone-acetaminophen (NORCO) 10-325 MG per tablet Take 1 tablet by mouth every 6 (six) hours as needed for pain.      Marland Kitchen KETOCONAZOLE, TOPICAL, 1 % SHAM Apply topically as directed.      . lovastatin (MEVACOR) 10 MG tablet Take 10 mg by mouth daily.      . metFORMIN (GLUCOPHAGE) 500 MG tablet Take 500 mg by mouth 2 (two) times daily with a meal.      . Multiple Vitamins-Minerals (MULTIVITAMIN PO) Take 1 tablet by mouth daily.      Marland Kitchen nystatin cream (MYCOSTATIN) Apply 1 application topically 2 (two) times daily.      . potassium chloride SA (K-DUR,KLOR-CON) 20 MEQ tablet Take 2 tablets (40 mEq total) by mouth daily.  60 tablet  11   No current facility-administered medications for this visit.     Past Medical History  Diagnosis Date  . Diabetes mellitus without complication   . Benign tumor of adrenal gland   . Spinal  stenosis   . Fatty liver   . Hypercholesterolemia   . Hypertension   . Diabetes mellitus     Dr Gabriel Carina endocrinology  . Fibroids   . DVT (deep venous thrombosis)   . Adrenal mass   . Palpitations   . Back pain     Dr Melanee Spry [facility]  . Ovarian cyst   . Sleep apnea   . Neck pain   . Hypokalemia   . Irregular menstrual cycle   . Hernia   . Leg muscle spasm   . Anemia   . SVT (supraventricular tachycardia)     [facility]    ROS:   All systems reviewed and negative except as noted in the HPI.   Past Surgical History  Procedure Laterality Date  . No past surgeries       Family History  Problem Relation Age of Onset  . Diabetes Mother   . Hypertension Mother   . Diabetes Father   . Hypertension Father   . Heart failure Paternal Uncle   . Hypertension Sister      History   Social History  . Marital Status: Single    Spouse Name: N/A    Number of Children:  N/A  . Years of Education: N/A   Occupational History  . Not on file.   Social History Main Topics  . Smoking status: Never Smoker   . Smokeless tobacco: Not on file  . Alcohol Use: No  . Drug Use: No  . Sexual Activity: Not on file   Other Topics Concern  . Not on file   Social History Narrative   ** Merged History Encounter **         BP 127/79  Pulse 70  Ht 5\' 9"  (1.753 m)  Wt 344 lb (156.037 kg)  BMI 50.78 kg/m2  Physical Exam:  Morbidly obese appearing 43 yo woman, NAD HEENT: Unremarkable Neck:  No JVD, no thyromegally Back:  No CVA tenderness Lungs:  Clear with no wheezes HEART:  Regular rate rhythm, no murmurs, no rubs, no clicks Abd:  soft, positive bowel sounds, no organomegally, no rebound, no guarding Ext:  2 plus pulses, no edema, no cyanosis, no clubbing Skin:  No rashes no nodules Neuro:  CN II through XII intact, motor grossly intact  EKG - NSR with a short PR but no clear cut pre-excitation.   Assess/Plan:

## 2014-02-04 ENCOUNTER — Telehealth: Payer: Self-pay | Admitting: Cardiology

## 2014-02-04 NOTE — Telephone Encounter (Signed)
Nothing was ever scheduled.  Per Dr Tanna Furry office note the patient was to call back and schedule when ready

## 2014-02-04 NOTE — Telephone Encounter (Signed)
We Did not schedule this. This needs to go to [facility] and Dr. Lovena Le

## 2014-02-04 NOTE — Telephone Encounter (Signed)
New message     Pt lost job---cannot have ablation now.  Do not schedule it.

## 2014-02-07 NOTE — Telephone Encounter (Signed)
Noted  

## 2014-04-28 ENCOUNTER — Telehealth: Payer: Self-pay | Admitting: Cardiology

## 2014-04-28 NOTE — Telephone Encounter (Signed)
Received a call from [place] with [facility].  She stated they are unable to get in touch with Ms. Diana Patton.  Called May, June and left messages to call regarding appointment.   Called this week and phone not working.   She will need to be re-referred  If you want her to be seen.

## 2014-04-28 NOTE — Telephone Encounter (Signed)
To Dr Radford Pax to make aware.

## 2014-05-02 ENCOUNTER — Encounter: Payer: Self-pay | Admitting: General Surgery

## 2014-06-03 ENCOUNTER — Encounter: Payer: Self-pay | Admitting: General Surgery

## 2014-12-31 IMAGING — CT CT ANGIO CHEST
2 of 7 series · 19 of 36 positions shown · IV contrast (APPLIED)
Comparison: 12/18/2012

CLINICAL DATA: Cardiac palpitations.  Hypertension.  Tachycardia.
Diabetes.

CT ANGIOGRAPHY CHEST
TECHNIQUE: Multidetector CT imaging of the chest using the
standard protocol during bolus administration of intravenous
contrast. Multiplanar reconstructed images including MIPs were
obtained and reviewed to evaluate the vascular anatomy.
Contrast: 60mL OMNIPAQUE IOHEXOL 350 MG/ML SOLN

[Series 6: pulm embolism 1.0 b25f st · axial · 0.77mm/px · z∈[+1184,+1386]mm · 18 of 225 slices shown]
[im 12/225  lung]
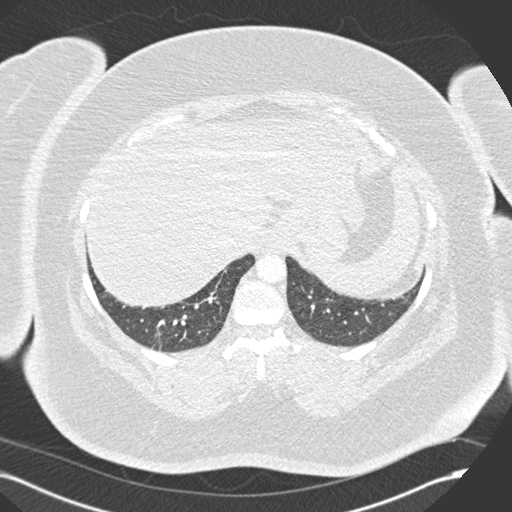
[im 23/225  mediastinal]
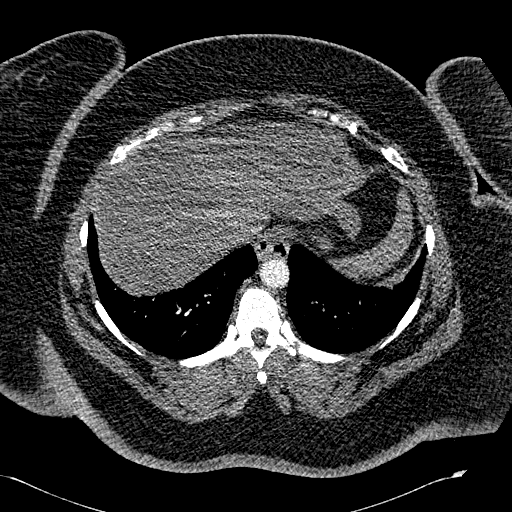
[im 34/225  lung]
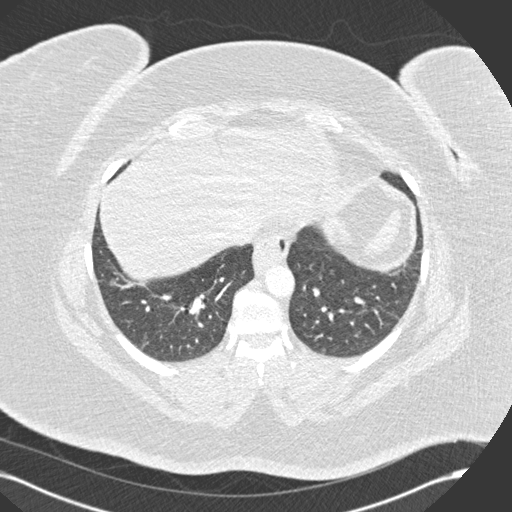
[im 45/225  mediastinal]
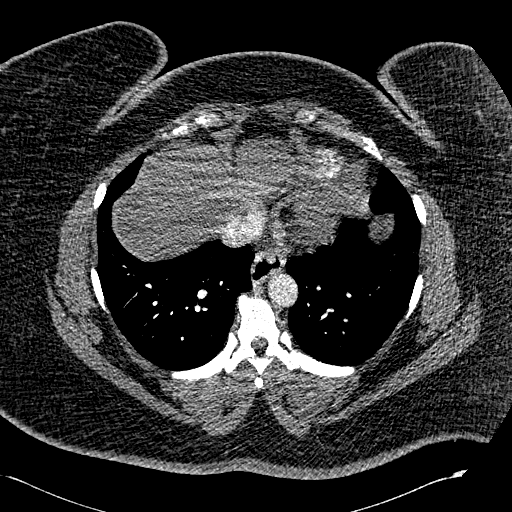
[im 57/225  lung]
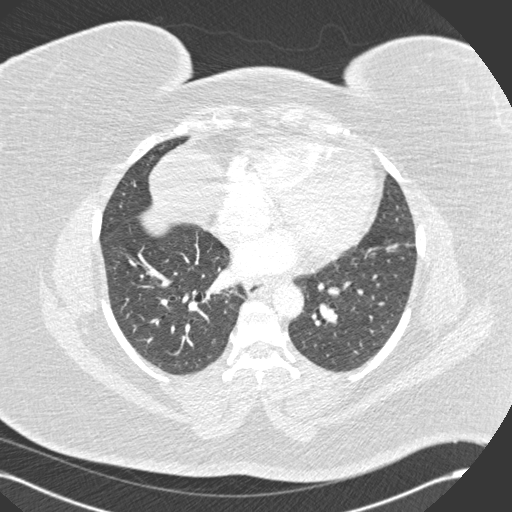
[im 68/225  mediastinal]
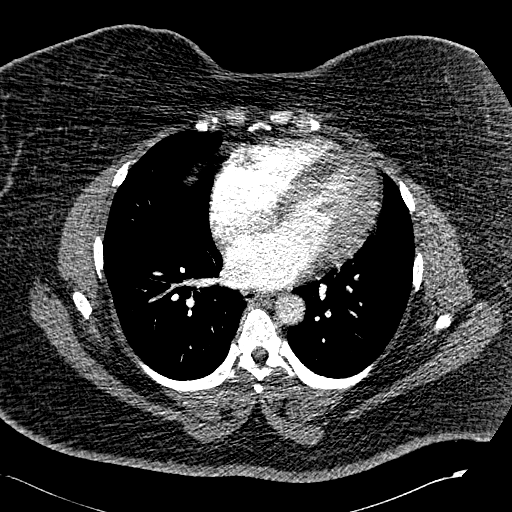
[im 79/225  lung]
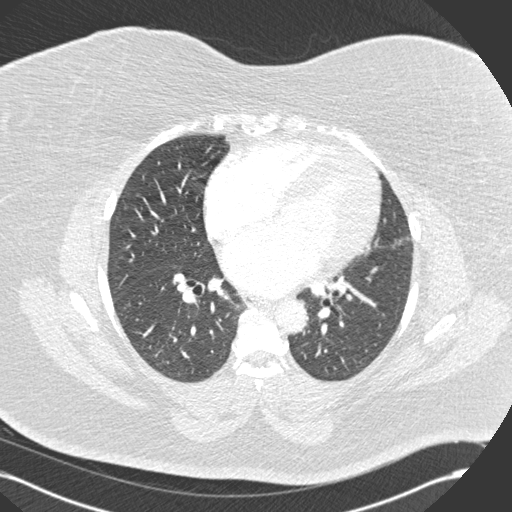
[im 90/225  mediastinal]
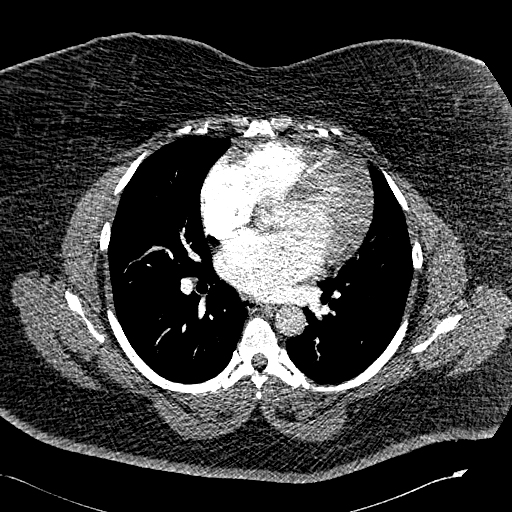
[im 101/225  lung]
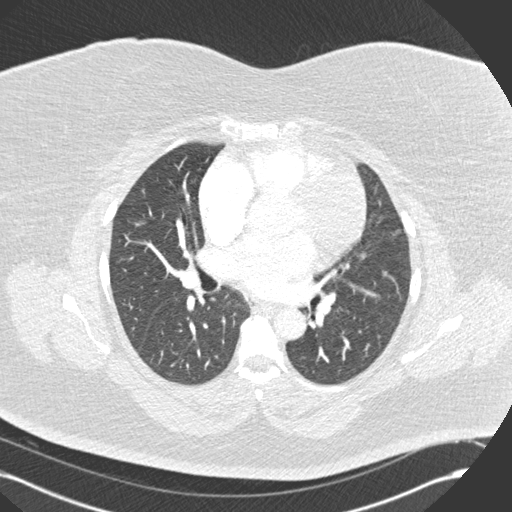
[im 124/225  mediastinal]
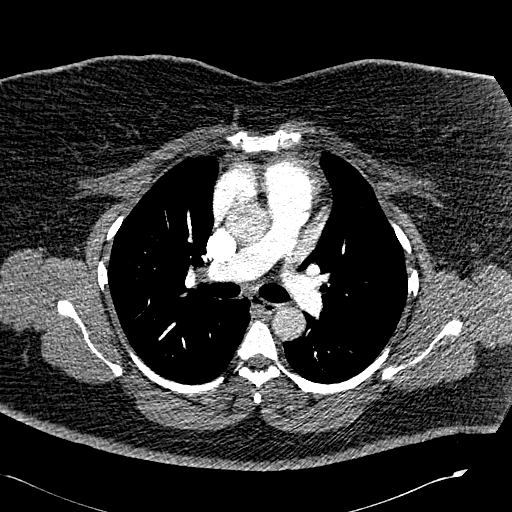
[im 135/225  lung]
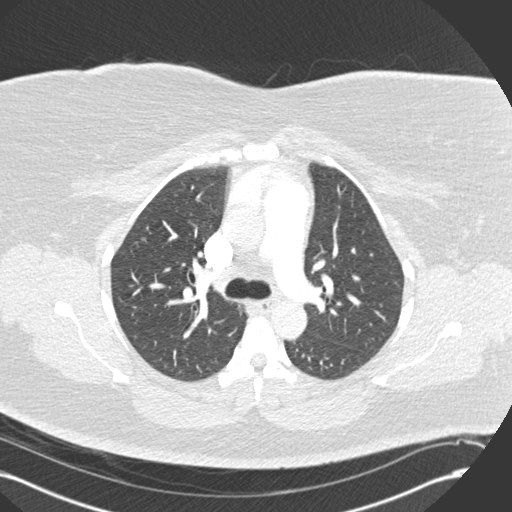
[im 146/225  mediastinal]
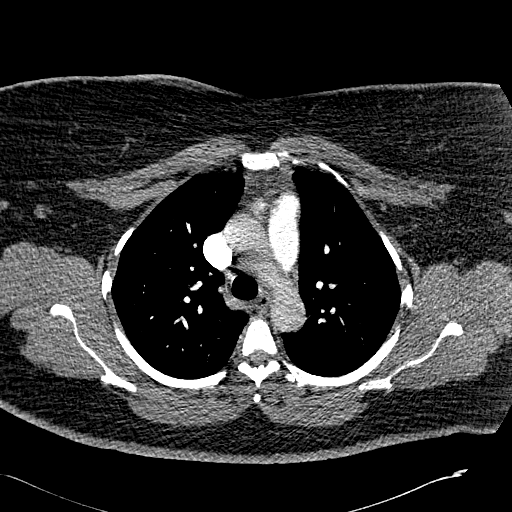
[im 157/225  lung]
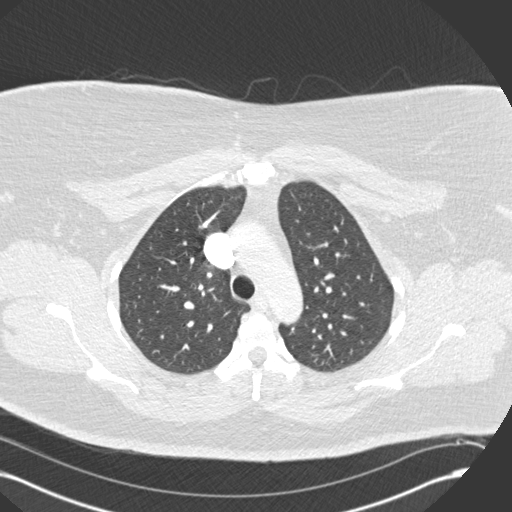
[im 169/225  mediastinal]
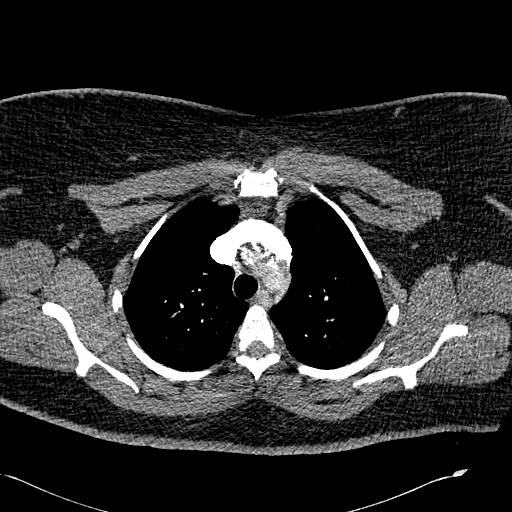
[im 180/225  lung]
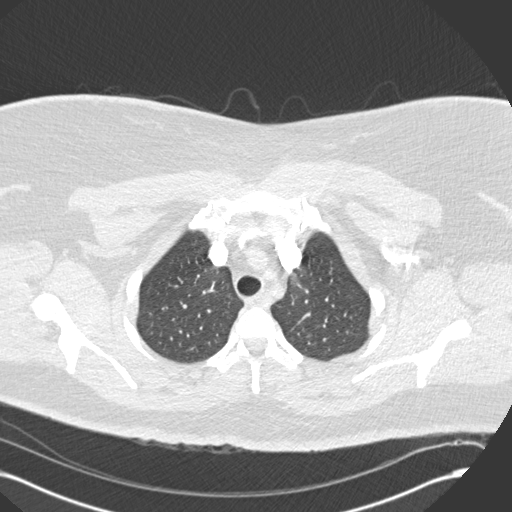
[im 191/225  mediastinal]
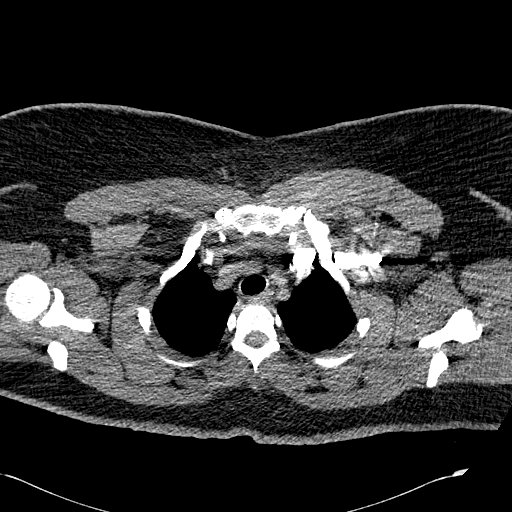
[im 202/225  lung]
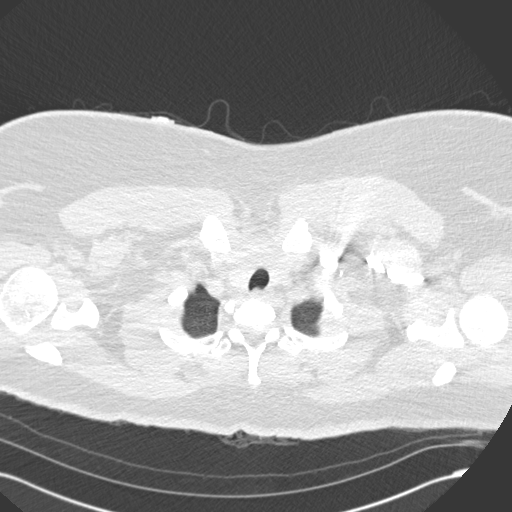
[im 213/225  mediastinal]
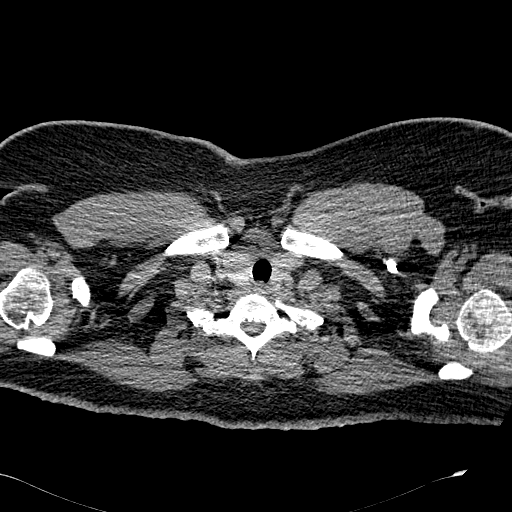

[Series 8: coronals · coronal · 0.49mm/px · 1 of 127 slices shown]
[im 64/127  mediastinal]
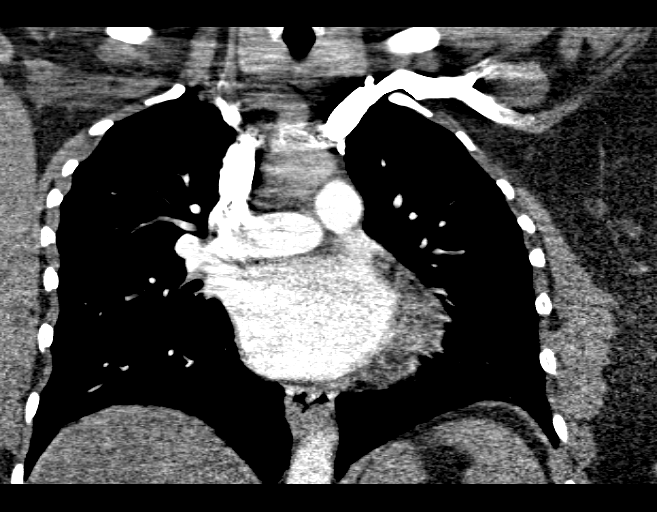

[19 of 36 positions shown; findings below may reference images not displayed]

FINDINGS: Technical factors related to patient body habitus reduce
diagnostic sensitivity and specificity.

No filling defect is identified in the pulmonary arterial tree to
suggest pulmonary embolus.

No reversal of the normal interventricular septal contour.

Small hiatal hernia observed.  Suspected hepatic steatosis.  No
pathologic thoracic adenopathy noted.  No pleural or pericardial
effusion.

There is linear subsegmental atelectasis in both lower lobes.
IMPRESSION: 1.  No filling defect is identified in the pulmonary arterial tree
to suggest pulmonary embolus.
2.  Subsegmental atelectasis in both lower lobes.
3.  Small hiatal hernia.
4.  Suspected hepatic steatosis.

## 2014-12-31 IMAGING — CR DG CHEST 2V
2 series · 2 of 2 positions shown · non-contrast
Comparison: None.

CLINICAL DATA: Shortness of breath

CHEST - 2 VIEW

[w chest pa]
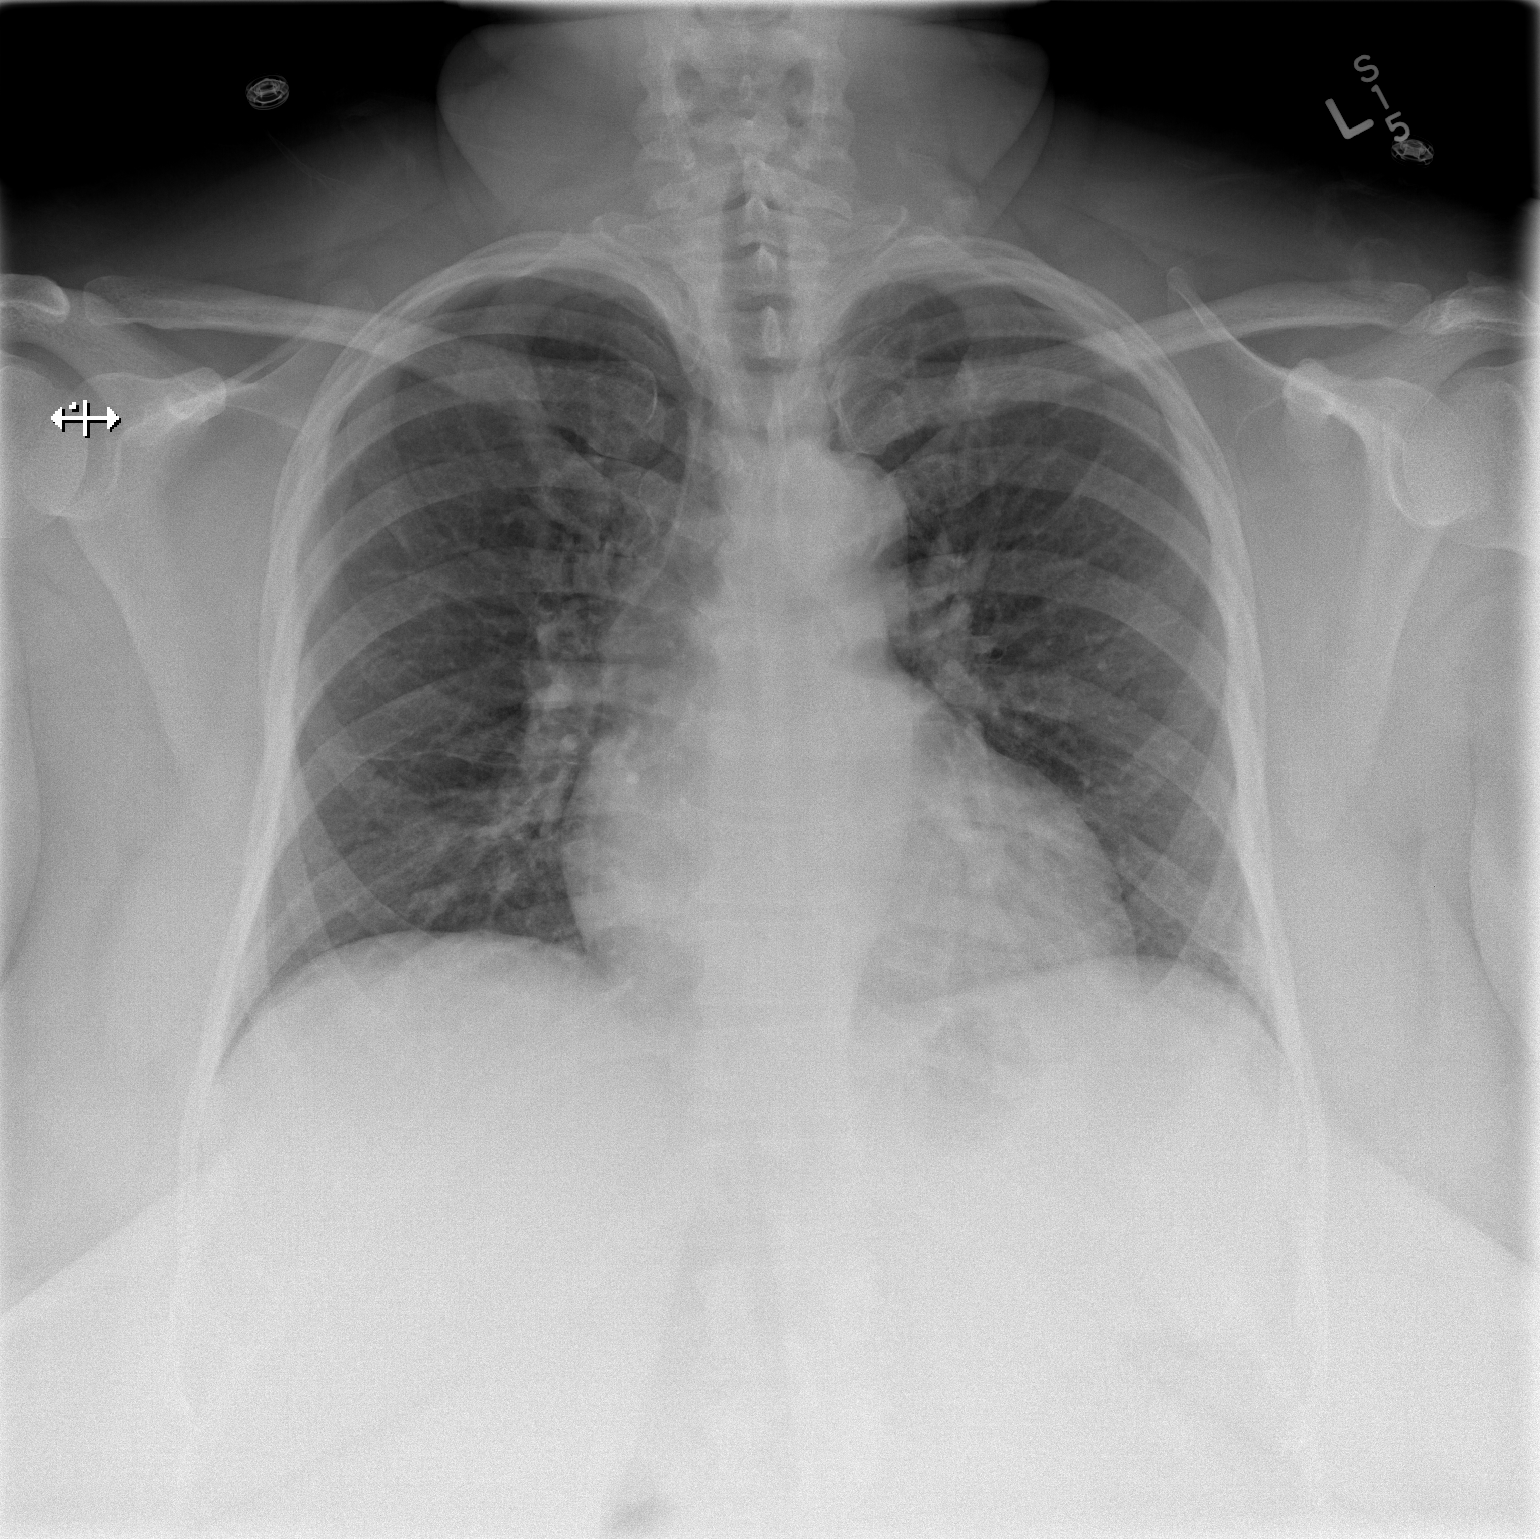

[w chest lat]
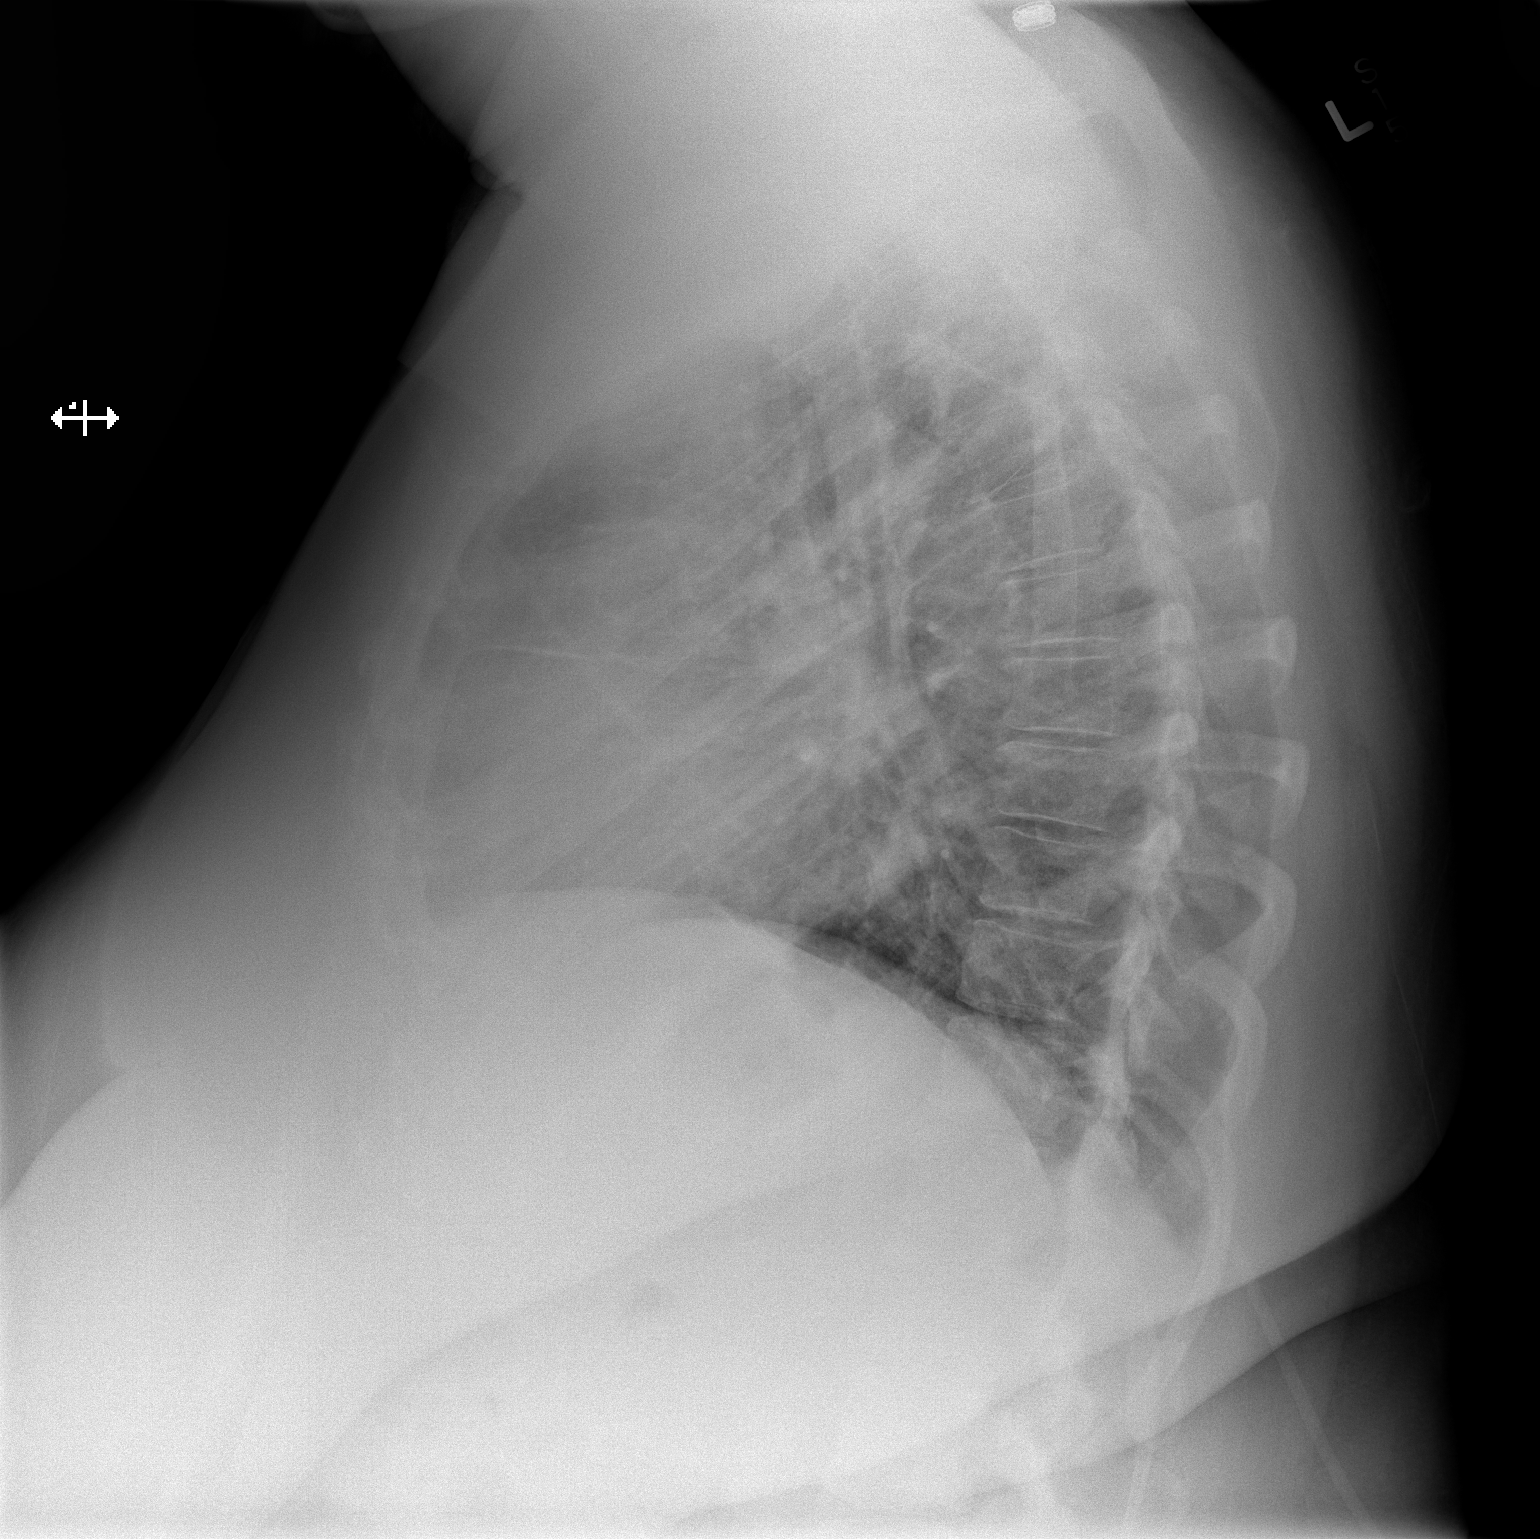

[2 of 2 positions shown; findings below may reference images not displayed]

FINDINGS: There is no edema or consolidation.  The heart size and
pulmonary vascularity are normal.  No adenopathy.  No apparent
pneumothorax.  No bone lesions.
IMPRESSION: No abnormality noted.  No apparent pneumothorax.

## 2015-03-06 ENCOUNTER — Telehealth: Payer: Self-pay | Admitting: Pulmonary Disease

## 2015-03-06 NOTE — Telephone Encounter (Signed)
Spoke with pt in regards to visits with our office only. Nothing further needed at this time.

## 2015-09-16 IMAGING — CR DG CHEST 1V PORT
1 series · 1 of 1 positions shown · non-contrast
Comparison: [facility] image for chest CT dated 06/12/2010

CLINICAL DATA: Shortness of breath.  Tachycardia.

EXAM:
PORTABLE CHEST - 1 VIEW

[ap]
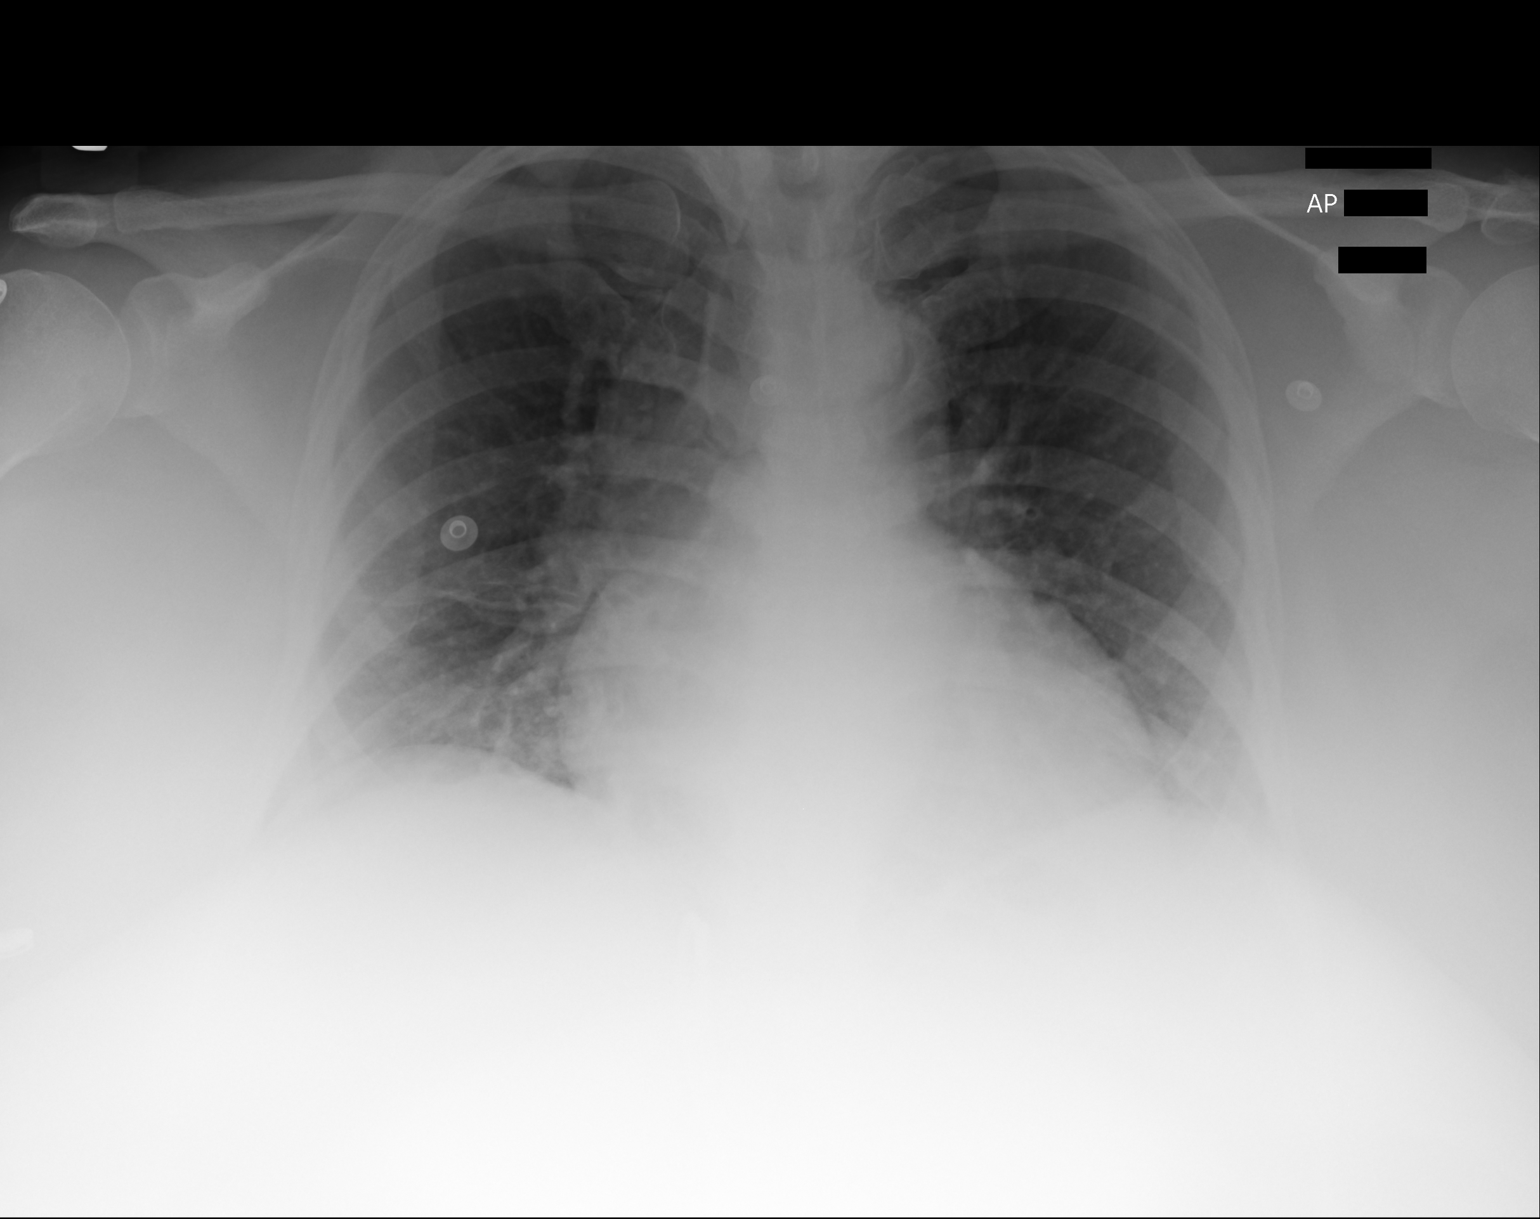

[1 of 1 positions shown; findings below may reference images not displayed]

FINDINGS: Slight cardiomegaly. Pulmonary vascularity is normal. Minimal linear
atelectasis or scarring in the right midzone. Lungs are otherwise
clear. No acute osseous abnormality.
IMPRESSION: Chronic cardiomegaly. Minimal atelectasis or scarring in the right
midzone.

## 2018-07-29 ENCOUNTER — Telehealth (INDEPENDENT_AMBULATORY_CARE_PROVIDER_SITE_OTHER): Payer: Self-pay

## 2018-07-29 NOTE — Telephone Encounter (Signed)
Lora from [employer] called and states she would like a CB from someone regarding the fax they received. States it was incomplete and would like to discuss further. Not sure who sent form?   CB [phone]

## 2018-07-30 NOTE — Telephone Encounter (Signed)
She hasn't seen Dr. Sharol Given since 2016. I could call and ask her to send a form, but would he sign it?

## 2018-07-30 NOTE — Telephone Encounter (Signed)
I did not fax form. It does not appear that she has seen Dr. Sharol Given before.

## 2018-07-30 NOTE — Telephone Encounter (Signed)
No ma'am not without seeing her in the office. We are happy to see her but can not do anything without eval.

## 2018-07-30 NOTE — Telephone Encounter (Signed)
I don't have a form for this patient °

## 2018-07-30 NOTE — Telephone Encounter (Signed)
Not sure what form was faxed then. I just took the message that was left on the Triage phone.

## 2018-08-12 NOTE — Telephone Encounter (Signed)
There is not a number to reach the pt back for this message. The number that is in the chart message is for a different name that the pt. She has not been seen in the office since 2016. What is the form that she is needing signed? We can not sign for something if we have not sen her in 3 years. Is it to attest to the fact that we saw her in 2016?

## 2018-08-12 NOTE — Telephone Encounter (Signed)
Patient called back and wanted to know why she needs to be evaluated again just for Dr. Sharol Given to sign a piece of paper for her to file a claim with her bank because [place] and would not be able to come in without it causing a hardship on her.  She is going to call back tomorrow to find out what was said.  Thank you.

## 2018-08-14 NOTE — Telephone Encounter (Signed)
Patient called back stating that it is to attest that we saw her back in 2016 and that the company just needs clarification

## 2018-08-26 ENCOUNTER — Ambulatory Visit (INDEPENDENT_AMBULATORY_CARE_PROVIDER_SITE_OTHER): Payer: Self-pay

## 2018-08-26 ENCOUNTER — Encounter (INDEPENDENT_AMBULATORY_CARE_PROVIDER_SITE_OTHER): Payer: Self-pay | Admitting: Orthopaedic Surgery

## 2018-08-26 ENCOUNTER — Ambulatory Visit (INDEPENDENT_AMBULATORY_CARE_PROVIDER_SITE_OTHER): Payer: BLUE CROSS/BLUE SHIELD | Admitting: Orthopaedic Surgery

## 2018-08-26 DIAGNOSIS — Z6841 Body Mass Index (BMI) 40.0 and over, adult: Secondary | ICD-10-CM

## 2018-08-26 DIAGNOSIS — G8929 Other chronic pain: Secondary | ICD-10-CM

## 2018-08-26 DIAGNOSIS — M25561 Pain in right knee: Secondary | ICD-10-CM

## 2018-08-26 DIAGNOSIS — M25562 Pain in left knee: Secondary | ICD-10-CM

## 2018-08-26 MED ORDER — LIDOCAINE HCL 1 % IJ SOLN
2.0000 mL | INTRAMUSCULAR | Status: AC | PRN
  Administered 2018-08-26: 2 mL

## 2018-08-26 MED ORDER — METHYLPREDNISOLONE ACETATE 40 MG/ML IJ SUSP
40.0000 mg | INTRAMUSCULAR | Status: AC | PRN
  Administered 2018-08-26: 40 mg via INTRA_ARTICULAR

## 2018-08-26 MED ORDER — BUPIVACAINE HCL 0.25 % IJ SOLN
2.0000 mL | INTRAMUSCULAR | Status: AC | PRN
  Administered 2018-08-26: 2 mL via INTRA_ARTICULAR

## 2018-08-26 NOTE — Progress Notes (Signed)
Office Visit Note   Patient: Diana Patton           Date of Birth: [birth year]           MRN: [id number] Visit Date: 08/26/2018              Requested by: Carol Ada, MD [address], [place] [address] PCP: Carol Ada, MD   Assessment & Plan: Visit Diagnoses:  1. Chronic pain of both knees   2. Morbid obesity ([place])   3. Body mass index 50.0-59.9, adult ([facility])     Plan: Impression is end-stage degenerative joint disease both knees.  We will proceed with left knee cortisone injection today.  Due to the underlying diabetes, do not want to inject both knees at today's visit.  She will follow-up with [place] in 2 weeks time for cortisone injection to the right knee.  In the meantime, she will make all efforts at weight loss as definitive treatment will be bilateral sequential total knee replacements. The patient meets the AMA guidelines for Morbid (severe) obesity with a BMI > 40.0 and I have recommended weight loss.   Follow-Up Instructions: Return in about 2 weeks (around 09/09/2018) for right knee cortisone injection.   Orders:  Orders Placed This Encounter  Procedures  . Large Joint Inj: L knee  . XR Knee Complete 4 Views Left  . XR Knee Complete 4 Views Right   No orders of the defined types were placed in this encounter.     Procedures: Large Joint Inj: L knee on 08/26/2018 4:51 PM Indications: pain Details: 22 G needle, anterolateral approach Medications: 2 mL lidocaine 1 %; 2 mL bupivacaine 0.25 %; 40 mg methylPREDNISolone acetate 40 MG/ML      Clinical Data: No additional findings.   Subjective: Chief Complaint  Patient presents with  . Right Knee - Pain  . Left Knee - Pain    HPI patient is a pleasant 47 year old female who presents to our clinic today with bilateral knee pain both equally as bad.  This is been ongoing for the past several years and is progressively worsened.  No known injury or change in activity, although she  does admit to occasional fall.  Pain she has to the entire aspect of both knees.  She describes this as constant ache worse with walking as well as going from a seated to standing position if she has been sitting for too long a period of time.  She is now having pain at night.  She has been taking Advil on occasion without relief of symptoms.  She does think she has had remote cortisone injections but is unsure when.  No viscosupplementation injections and no surgical intervention to either knee.  Of note, she is diabetic and states that her last A1c was around 7.5.  She also has a previous history of DVT.  Review of Systems as detailed in HPI.  All others reviewed and are negative.   Objective: Vital Signs: There were no vitals taken for this visit.  Physical Exam well-developed well-nourished female no acute distress per  Ortho Exam alert and oriented x3.  Examination of both knees is range of motion 0 to 95 degrees.  Medial joint line tenderness both sides.  She is stable valgus and varus stress.  Moderate patellofemoral crepitus right greater than left.  She is neurovascularly intact distally.  Specialty Comments:  No specialty comments available.  Imaging: Xr Knee Complete 4 Views Left  Result  Date: 08/26/2018 Significant medial and patellofemoral degenerative changes with periarticular spurring  Xr Knee Complete 4 Views Right  Result Date: 08/26/2018 Significant medial and patellofemoral degenerative changes with periarticular spurring    PMFS History: Patient Active Problem List   Diagnosis Date Noted  . Chronic pain of both knees 08/26/2018  . Morbid obesity ([place]) 08/26/2018  . Body mass index 50.0-59.9, adult ([place]) 08/26/2018  . SVT (supraventricular tachycardia) ([facility])    Past Medical History:  Diagnosis Date  . Adrenal mass ([place])   . Anemia   . Back pain    Dr Melanee Spry [facility]  . Benign tumor of adrenal gland   . Diabetes mellitus    Dr Gabriel Carina endocrinology  .  Diabetes mellitus without complication ([place])   . DVT (deep venous thrombosis) ([place])   . Fatty liver   . Fibroids   . Hernia   . Hypercholesterolemia   . Hypertension   . Hypokalemia   . Irregular menstrual cycle   . Leg muscle spasm   . Neck pain   . Ovarian cyst   . Palpitations   . Sleep apnea   . Spinal stenosis   . SVT (supraventricular tachycardia) [facility])    [facility]    Family History  Problem Relation Age of Onset  . Diabetes Mother   . Hypertension Mother   . Diabetes Father   . Hypertension Father   . Heart failure Paternal Uncle   . Hypertension Sister     Past Surgical History:  Procedure Laterality Date  . NO PAST SURGERIES     Social History   Occupational History  . Not on file  Tobacco Use  . Smoking status: Never Smoker  . Smokeless tobacco: Never Used  Substance and Sexual Activity  . Alcohol use: No  . Drug use: No  . Sexual activity: Not on file

## 2018-09-24 ENCOUNTER — Ambulatory Visit (INDEPENDENT_AMBULATORY_CARE_PROVIDER_SITE_OTHER): Payer: BLUE CROSS/BLUE SHIELD | Admitting: Orthopaedic Surgery

## 2019-06-03 ENCOUNTER — Telehealth: Payer: Self-pay | Admitting: Orthopaedic Surgery

## 2019-06-03 NOTE — Telephone Encounter (Signed)
Received a call from Wende Crease w/ [employer]. She stated patient had filed a complaint that we were withholding her records. I told her the last correspondence was that our office completed a form for her in Dec. 2019. We have not received a request for her records from any entity. I advied her the patient has not called requesting her records. Ms. Mindi Junker stated she the patient had sent her a copy of a release form dated April. I told her that we have not received it. And that had the patient called to check the status of the request, she would have been advised that we didn't have it and to re send it.I told Ms. Petty Upon receipt of valid completed release form, it would be processed. I gave her our fax numbers. She said she would be in contact with the patient. Ph [phone]

## 2019-06-21 ENCOUNTER — Telehealth: Payer: Self-pay | Admitting: Orthopaedic Surgery

## 2019-06-21 NOTE — Telephone Encounter (Signed)
Received vm from patient requesting records release form to be mailed to her. I mailed to her address [address] [place] [address]

## 2019-07-13 ENCOUNTER — Telehealth: Payer: Self-pay | Admitting: Orthopaedic Surgery

## 2019-07-13 NOTE — Telephone Encounter (Signed)
Received call from Wende Crease w/ [employer]. I verified we received patients auth for records however, the date range was not filled in. She stated she will reach out to patient to inform that the date range is needed so we can complete the request for records. Loma Sousa will be back in touch with me to let me know to which means to return the form to the patient so she can fill in date range. Loma Sousa ph# [phone]

## 2019-07-14 ENCOUNTER — Telehealth: Payer: Self-pay | Admitting: Orthopaedic Surgery

## 2019-07-14 NOTE — Telephone Encounter (Signed)
Received a call from [place] with [facility]. He wanted to verify that no records between 09/06/15 and 11/30/2010. I spoke with him and advised patient seen 09/06/15 and not seen in between that 11/30/2010. She was only seen once in 2016. I verified our fax numbers with him. He stated he may fax additional request from Dr Sharol Given. Although he has records, he stated additional statement may be needed from Dr Sharol Given. Cun#[phone] [id number]
